# Patient Record
Sex: Male | Born: 1955 | Race: White | Hispanic: No | Marital: Married | Smoking: Former smoker
Health system: Southern US, Community
[De-identification: ages and names within clinical notes are randomized; demographics above are authoritative.]

## PROBLEM LIST (undated history)

## (undated) DIAGNOSIS — J449 Chronic obstructive pulmonary disease, unspecified: Secondary | ICD-10-CM

## (undated) DIAGNOSIS — M5030 Other cervical disc degeneration, unspecified cervical region: Secondary | ICD-10-CM

## (undated) DIAGNOSIS — K219 Gastro-esophageal reflux disease without esophagitis: Secondary | ICD-10-CM

## (undated) DIAGNOSIS — S83209A Unspecified tear of unspecified meniscus, current injury, unspecified knee, initial encounter: Secondary | ICD-10-CM

## (undated) HISTORY — DX: Chronic obstructive pulmonary disease, unspecified: J44.9

## (undated) HISTORY — PX: KNEE ARTHROSCOPY: SHX127

## (undated) HISTORY — DX: Gastro-esophageal reflux disease without esophagitis: K21.9

## (undated) HISTORY — PX: NECK SURGERY: SHX720

---

## 2004-04-11 ENCOUNTER — Ambulatory Visit: Payer: Self-pay | Admitting: Internal Medicine

## 2004-05-08 ENCOUNTER — Ambulatory Visit: Payer: Self-pay | Admitting: Internal Medicine

## 2004-06-08 ENCOUNTER — Ambulatory Visit: Payer: Self-pay | Admitting: Pulmonary Disease

## 2004-07-16 ENCOUNTER — Ambulatory Visit: Payer: Self-pay | Admitting: Pulmonary Disease

## 2004-08-14 ENCOUNTER — Ambulatory Visit: Payer: Self-pay | Admitting: Pulmonary Disease

## 2004-09-12 ENCOUNTER — Ambulatory Visit: Payer: Self-pay | Admitting: Pulmonary Disease

## 2004-10-16 ENCOUNTER — Ambulatory Visit: Payer: Self-pay | Admitting: Pulmonary Disease

## 2004-11-15 ENCOUNTER — Ambulatory Visit: Payer: Self-pay | Admitting: Pulmonary Disease

## 2004-12-20 ENCOUNTER — Ambulatory Visit: Payer: Self-pay | Admitting: Pulmonary Disease

## 2005-01-22 ENCOUNTER — Ambulatory Visit: Payer: Self-pay | Admitting: Internal Medicine

## 2005-02-19 ENCOUNTER — Ambulatory Visit: Payer: Self-pay | Admitting: Critical Care Medicine

## 2005-03-19 ENCOUNTER — Ambulatory Visit: Payer: Self-pay | Admitting: Pulmonary Disease

## 2005-03-25 ENCOUNTER — Ambulatory Visit: Payer: Self-pay | Admitting: Pulmonary Disease

## 2005-04-01 ENCOUNTER — Ambulatory Visit: Payer: Self-pay | Admitting: Pulmonary Disease

## 2005-04-15 ENCOUNTER — Ambulatory Visit: Payer: Self-pay | Admitting: Pulmonary Disease

## 2005-05-15 ENCOUNTER — Ambulatory Visit: Payer: Self-pay | Admitting: Critical Care Medicine

## 2005-06-12 ENCOUNTER — Ambulatory Visit: Payer: Self-pay | Admitting: Pulmonary Disease

## 2005-07-10 ENCOUNTER — Ambulatory Visit: Payer: Self-pay | Admitting: Critical Care Medicine

## 2005-08-07 ENCOUNTER — Ambulatory Visit: Payer: Self-pay | Admitting: Critical Care Medicine

## 2005-09-04 ENCOUNTER — Ambulatory Visit: Payer: Self-pay | Admitting: Pulmonary Disease

## 2005-10-02 ENCOUNTER — Ambulatory Visit: Payer: Self-pay | Admitting: Internal Medicine

## 2005-10-02 ENCOUNTER — Ambulatory Visit: Payer: Self-pay | Admitting: Pulmonary Disease

## 2005-10-30 ENCOUNTER — Ambulatory Visit: Payer: Self-pay | Admitting: Internal Medicine

## 2005-11-05 ENCOUNTER — Ambulatory Visit: Payer: Self-pay | Admitting: Pulmonary Disease

## 2005-12-10 ENCOUNTER — Ambulatory Visit: Payer: Self-pay | Admitting: Pulmonary Disease

## 2006-01-09 ENCOUNTER — Ambulatory Visit: Payer: Self-pay | Admitting: Pulmonary Disease

## 2006-02-06 ENCOUNTER — Ambulatory Visit: Payer: Self-pay | Admitting: Pulmonary Disease

## 2006-03-06 ENCOUNTER — Ambulatory Visit: Payer: Self-pay | Admitting: Pulmonary Disease

## 2006-04-07 ENCOUNTER — Ambulatory Visit: Payer: Self-pay | Admitting: Pulmonary Disease

## 2006-04-07 ENCOUNTER — Ambulatory Visit: Payer: Self-pay | Admitting: Critical Care Medicine

## 2006-04-16 ENCOUNTER — Ambulatory Visit: Payer: Self-pay | Admitting: Internal Medicine

## 2006-04-29 ENCOUNTER — Ambulatory Visit: Payer: Self-pay | Admitting: Internal Medicine

## 2006-05-05 ENCOUNTER — Ambulatory Visit: Payer: Self-pay | Admitting: Internal Medicine

## 2006-06-04 ENCOUNTER — Ambulatory Visit: Payer: Self-pay | Admitting: Internal Medicine

## 2006-07-30 ENCOUNTER — Ambulatory Visit: Payer: Self-pay | Admitting: Pulmonary Disease

## 2006-09-08 ENCOUNTER — Ambulatory Visit: Payer: Self-pay | Admitting: Pulmonary Disease

## 2006-09-29 ENCOUNTER — Ambulatory Visit: Payer: Self-pay | Admitting: Pulmonary Disease

## 2006-10-07 ENCOUNTER — Ambulatory Visit: Payer: Self-pay | Admitting: Pulmonary Disease

## 2006-11-11 ENCOUNTER — Ambulatory Visit: Payer: Self-pay | Admitting: Pulmonary Disease

## 2006-11-26 ENCOUNTER — Ambulatory Visit: Payer: Self-pay | Admitting: Pulmonary Disease

## 2006-12-04 ENCOUNTER — Ambulatory Visit: Payer: Self-pay | Admitting: Internal Medicine

## 2006-12-12 ENCOUNTER — Ambulatory Visit: Payer: Self-pay | Admitting: Pulmonary Disease

## 2007-01-12 ENCOUNTER — Ambulatory Visit: Payer: Self-pay | Admitting: Pulmonary Disease

## 2007-01-12 ENCOUNTER — Ambulatory Visit: Payer: Self-pay | Admitting: Internal Medicine

## 2007-02-18 ENCOUNTER — Ambulatory Visit: Payer: Self-pay | Admitting: Pulmonary Disease

## 2007-03-23 ENCOUNTER — Ambulatory Visit: Payer: Self-pay | Admitting: Pulmonary Disease

## 2007-05-04 ENCOUNTER — Ambulatory Visit: Payer: Self-pay | Admitting: Pulmonary Disease

## 2007-06-02 ENCOUNTER — Ambulatory Visit: Payer: Self-pay | Admitting: Internal Medicine

## 2007-07-10 ENCOUNTER — Ambulatory Visit: Payer: Self-pay | Admitting: Internal Medicine

## 2007-08-12 ENCOUNTER — Ambulatory Visit: Payer: Self-pay | Admitting: Pulmonary Disease

## 2007-09-04 ENCOUNTER — Encounter (INDEPENDENT_AMBULATORY_CARE_PROVIDER_SITE_OTHER): Payer: Self-pay | Admitting: *Deleted

## 2007-09-08 DIAGNOSIS — K219 Gastro-esophageal reflux disease without esophagitis: Secondary | ICD-10-CM | POA: Insufficient documentation

## 2007-09-08 DIAGNOSIS — J449 Chronic obstructive pulmonary disease, unspecified: Secondary | ICD-10-CM

## 2007-09-08 DIAGNOSIS — J984 Other disorders of lung: Secondary | ICD-10-CM

## 2007-09-09 ENCOUNTER — Encounter: Payer: Self-pay | Admitting: Internal Medicine

## 2007-09-09 ENCOUNTER — Ambulatory Visit: Payer: Self-pay | Admitting: Internal Medicine

## 2007-09-15 DIAGNOSIS — J45909 Unspecified asthma, uncomplicated: Secondary | ICD-10-CM | POA: Insufficient documentation

## 2007-10-06 ENCOUNTER — Ambulatory Visit: Payer: Self-pay | Admitting: Internal Medicine

## 2007-11-05 ENCOUNTER — Ambulatory Visit: Payer: Self-pay | Admitting: Internal Medicine

## 2007-12-08 ENCOUNTER — Ambulatory Visit: Payer: Self-pay | Admitting: Internal Medicine

## 2008-01-05 ENCOUNTER — Ambulatory Visit: Payer: Self-pay | Admitting: Internal Medicine

## 2008-02-04 ENCOUNTER — Ambulatory Visit: Payer: Self-pay | Admitting: Internal Medicine

## 2008-02-23 ENCOUNTER — Encounter: Payer: Self-pay | Admitting: Internal Medicine

## 2008-03-09 ENCOUNTER — Ambulatory Visit: Payer: Self-pay | Admitting: Internal Medicine

## 2008-04-08 ENCOUNTER — Ambulatory Visit: Payer: Self-pay | Admitting: Internal Medicine

## 2008-05-06 ENCOUNTER — Ambulatory Visit: Payer: Self-pay | Admitting: Internal Medicine

## 2008-06-07 ENCOUNTER — Encounter: Payer: Self-pay | Admitting: Internal Medicine

## 2008-06-08 ENCOUNTER — Ambulatory Visit: Payer: Self-pay | Admitting: Internal Medicine

## 2008-06-10 ENCOUNTER — Telehealth (INDEPENDENT_AMBULATORY_CARE_PROVIDER_SITE_OTHER): Payer: Self-pay | Admitting: *Deleted

## 2008-06-17 ENCOUNTER — Encounter: Payer: Self-pay | Admitting: Internal Medicine

## 2008-07-05 ENCOUNTER — Ambulatory Visit: Payer: Self-pay | Admitting: Internal Medicine

## 2008-08-04 ENCOUNTER — Ambulatory Visit: Payer: Self-pay | Admitting: Internal Medicine

## 2008-08-12 ENCOUNTER — Telehealth: Payer: Self-pay | Admitting: Pulmonary Disease

## 2008-08-12 ENCOUNTER — Encounter: Payer: Self-pay | Admitting: Pulmonary Disease

## 2008-09-07 ENCOUNTER — Ambulatory Visit: Payer: Self-pay | Admitting: Internal Medicine

## 2008-10-05 ENCOUNTER — Ambulatory Visit: Payer: Self-pay | Admitting: Internal Medicine

## 2008-10-25 ENCOUNTER — Ambulatory Visit: Payer: Self-pay | Admitting: Internal Medicine

## 2008-10-25 DIAGNOSIS — J45901 Unspecified asthma with (acute) exacerbation: Secondary | ICD-10-CM | POA: Insufficient documentation

## 2008-10-30 ENCOUNTER — Ambulatory Visit: Payer: Self-pay | Admitting: Pulmonary Disease

## 2008-10-30 ENCOUNTER — Inpatient Hospital Stay (HOSPITAL_COMMUNITY): Admission: EM | Admit: 2008-10-30 | Discharge: 2008-11-02 | Payer: Self-pay | Admitting: Emergency Medicine

## 2008-11-10 ENCOUNTER — Ambulatory Visit: Payer: Self-pay | Admitting: Internal Medicine

## 2008-11-16 ENCOUNTER — Telehealth: Payer: Self-pay | Admitting: Pulmonary Disease

## 2008-12-02 ENCOUNTER — Ambulatory Visit (HOSPITAL_COMMUNITY): Admission: RE | Admit: 2008-12-02 | Discharge: 2008-12-02 | Payer: Self-pay | Admitting: Neurological Surgery

## 2009-04-07 ENCOUNTER — Telehealth: Payer: Self-pay | Admitting: Internal Medicine

## 2009-04-20 ENCOUNTER — Ambulatory Visit: Payer: Self-pay | Admitting: Internal Medicine

## 2009-05-02 ENCOUNTER — Encounter: Payer: Self-pay | Admitting: Internal Medicine

## 2009-05-03 ENCOUNTER — Encounter: Payer: Self-pay | Admitting: Internal Medicine

## 2009-10-10 ENCOUNTER — Ambulatory Visit: Payer: Self-pay | Admitting: Internal Medicine

## 2009-11-13 ENCOUNTER — Ambulatory Visit: Payer: Self-pay | Admitting: Internal Medicine

## 2010-06-07 ENCOUNTER — Telehealth (INDEPENDENT_AMBULATORY_CARE_PROVIDER_SITE_OTHER): Payer: Self-pay | Admitting: *Deleted

## 2010-06-14 ENCOUNTER — Ambulatory Visit
Admission: RE | Admit: 2010-06-14 | Discharge: 2010-06-14 | Payer: Self-pay | Source: Home / Self Care | Attending: Internal Medicine | Admitting: Internal Medicine

## 2010-06-17 ENCOUNTER — Emergency Department (HOSPITAL_BASED_OUTPATIENT_CLINIC_OR_DEPARTMENT_OTHER)
Admission: EM | Admit: 2010-06-17 | Discharge: 2010-06-17 | Payer: Self-pay | Source: Home / Self Care | Admitting: Emergency Medicine

## 2010-07-03 NOTE — Assessment & Plan Note (Signed)
Summary: Pulmonary/ ext f/u ov with HFA 90% effective, add spiriva   Primary Provider/Referring Provider:  Rosezetta Schlatter  CC:  Acute visit.  Pt c/o tightness in chest and wheezing and cough since March 2011.  Cough is mainly dry but sometimes produces clear sputum.  [name]  History of Present Illness: 55 yowm   quit smoking in 1999  an FEV1 of 45%  predicted, documented in December of 2007.     July 05, 2008-routine office viist. Doing well rare use of duoneb,  still get SOB w/ activity and wears out easily.   OV 10/25/2008. Followup Asthma. Maintained on Xolair + symbi + singulair + zyrtec + PPI.  Made an acute visit to discuss subacute worsenig of asthma. Here with wife. Wants consideration for thermoplasty. Wonders if O2 will help him; claims desaturation on treadmill or while performing karate.   November 10, 2008 ov on best days no limits in terms of activity off 02 including going up steps, kayak carry and paddling  without difficulty but presently still on prednisone at 22 and has pred dependent since around first of march,  also maintaining xolair x at least 3 years ? benefit.  maint on  singulair, zyrtec, advair, protonix,  as needed combivent or duoneb but no using at all.   April 20, 2009 no limitations in breathing, no nocturnal resp co's, the best he's done in years. rec work on inhaler technique treatment.  Oct 10, 2009 Acute visit.  Pt c/o tightness in chest, wheezing and cough since March 2011.  Cough is mainly dry but sometimes produces clear sputum.  has not been on symbicort/spiriva though is using lots of combivent.   no purulent sputum. Pt denies any significant sore throat, dysphagia, itching, sneezing,  nasal congestion or excess secretions,  fever, chills, sweats, unintended wt loss, pleuritic or exertional cp, hempoptysis, change in activity tolerance  orthopnea pnd or leg swelling Pt also denies any obvious fluctuation in symptoms with weather or environmental change or other  alleviating or aggravating factors.       Current Medications (verified): 1)  Protonix 40 Mg  Solr (Pantoprazole Sodium) .... Take  One 30-60 Min Before First Meal of The Day 2)  Symbicort 160-4.5 Mcg/act Aero (Budesonide-Formoterol Fumarate) .... 2 Puffs Every 12 Hours 3)  Combivent 103-18 Mcg/act  Aero (Ipratropium-Albuterol) .... Inhale 2 Puffs Every 4-6 Hours As Needed 4)  Duoneb 0.5-2.5 (3) Mg/45ml  Soln (Ipratropium-Albuterol) .... Inhale 1 Vial Via Hhn Every 4 Hours As Needed 5)  Zyrtec Hives Relief 10 Mg Tabs (Cetirizine Hcl) .[name].. 1 By Mouth At Bedtime  Allergies (verified): 1)  ! Pcn  Past History:  Past Medical History: GERD COPD/AB..............................................................................................Travis KitchenDr [name]     -quit smoking in 1999 . Smoked 1 pack a day for 20-25 years    - FEV1 of 45% of the predicted, documented in December of 2007.      - FEV1     48%   November 10, 2008  with ratio 49    - Alpha1AT nl  10/26/2007    - HFA 75% November 10, 2008 >  90% April 20, 2009 > 100% Oct 10, 2009     - Xolair stopped after approx 3 years November 10, 2008     Vital Signs:  Patient profile:   55 year old male Weight:      180 pounds BMI:     26.68 O2 Sat:      93 % on Room air Temp:  97.1 degrees F oral Pulse rate:   96 / minute BP sitting:   108 / 72  (left arm)  Vitals Entered By: Vernie Murders (Oct 10, 2009 1:41 PM)  O2 Flow:  Room air  Physical Exam  Additional Exam:  wt  184 > 180 Oct 10, 2009  HEENT mild turbinate edema.  Oropharynx no thrush or excess pnd or cobblestoning.  No JVD or cervical adenopathy. Mild accessory muscle hypertrophy. Trachea midline, nl thryroid. Chest was hyperinflated by percussion with diminished breath sounds and moderate increased exp time without wheeze. Hoover sign positive at mid inspiration. Regular rate and rhythm without murmur gallop or rub or increase P2 or edema.  Abd: no hsm, nl excursion. Ext warm without  cyanosis or clubbing.     Impression & Recommendations:  Problem # 1:  COPD (ICD-496) GOLD III with excess use of combivent  DDX of  difficult airways managment all start with A and  include Adherence, Ace Inhibitors, Acid Reflux, Active Sinus Disease, Alpha 1 Antitripsin deficiency, Anxiety masquerading as Airways dz,  ABPA,  allergy(esp in young), Aspiration (esp in elderly), Adverse effects of DPI,  Active smokers, plus one B  = Beta blocker use..    Adherence:  reviewed present rx and mdi technique now 90% after coaching Rec add spiriva and use xopenex as a rescue  Acid Reflux:  use ppi and diet reviewed  Steroid rx reviewed. The goal with a chronic steroid dependent illness is always arriving at the lowest effective dose that controls the disease/symptoms and not accepting a set "formula" which is based on statistics that don't take into accound individual variability or the natural hx of the dz in every individual patient, which may well vary over time. for now pred 20 taper to floor of 0 is goal.  Medications Added to Medication List This Visit: 1)  Spiriva Handihaler 18 Mcg Caps (Tiotropium bromide monohydrate) .... Two puffs in handihaler daily 2)  Xopenex Hfa 45 Mcg/act Aero (Levalbuterol tartrate) .... 2 puffs every 4hours if needed 3)  Prednisone 10 Mg Tabs (Prednisone) .... Prednisone 10 mg  2 daily until better then one daily x 5 days and stop  Other Orders: Est. Patient Level IV (16109)  Patient Instructions: 1)  I think of spiriva in this setting like purchasing high octane fuel for an older car with lots of miles on it. It may help the perfomance enough to warrant the purchase, but it won't change the longevity of the car or make it any easier parking it. It should improve peak performance if the patient is patient and lets the medicine work the way it's intended  - improving activity tolerance where limits on the mechanical ventilatory ceiling causing dynamic  hyperinflation is the problem.  2)  Spiriva 1 capsule each am, two good deep drags x one month trial 3)  If breathing worsening,  Prednisone 10 mg 2 each am 100%, then 1 each am and stop after 5 days. 4)  If real short of short breath (needing prednisone) ok to use rescue of xopenex but goal is less twice weekly 5)  Try off combivent 6)  only duoneb in severe cases  7)  Please schedule a follow-up appointment in 4 weeks, sooner if needed  Prescriptions: PREDNISONE 10 MG  TABS (PREDNISONE) Prednisone 10 mg  2 daily until better then one daily x 5 days and stop  #50 x 0   Entered and Authorized by:   Nyoka Cowden MD  Signed by:   Nyoka Cowden MD on 10/10/2009   Method used:   Electronically to        [facility]  [address] [phone]* (retail)       [address] [place], [place]  [address]       Ph: [phone] or [phone]       Fax: [phone]   RxID:   [id number]   Appended Document: Pulmonary/ ext f/u ov with HFA 90% effective, add spiriva fax copy to Dr Rosezetta Schlatter at [facility] prac  Appended Document: Pulmonary/ ext f/u ov with HFA 90% effective, add spiriva Copy sent to Dr Rosezetta Schlatter via [place].

## 2010-07-03 NOTE — Assessment & Plan Note (Signed)
Summary: Pulmonary/ ext f/u ov with hfa/dpi teaching > 90% mastered   Primary Provider/Referring Provider:  Rosezetta Schlatter  CC:  4 wk followup. Pt c/o sore throat "comes and goes" x 2 wks.  He states that the wheezing has resolved but still has some chest congestion and prod cough with thick clear sputum.Marland Kitchen  History of Present Illness: 59 yowm  quit smoking in 1999  an FEV1 of 45%  predicted, documented in December of 2007.    une 10, 2010 ov on best days no limits in terms of activity off 02 including going up steps, kayak carry and paddling  without difficulty but presently still on prednisone at 22 and has pred dependent since around first of march,  also maintaining xolair x at least 3 years ? benefit.  maint on  singulair, zyrtec, advair, protonix,  as needed combivent or duoneb but no using at all.   April 20, 2009 no limitations in breathing, no nocturnal resp co's, the best he's done in years. rec work on inhaler technique treatment.  Oct 10, 2009 Acute visit.  Pt c/o tightness in chest, wheezing and cough since March 2011.  Cough is mainly dry but sometimes produces clear sputum.  has not been on symbicort/spiriva though is using lots of combivent.   Spiriva 1 capsule each am, two good deep drags x one month trial If breathing worsening,   If real short of short breath (needing prednisone) ok to use rescue of xopenex but goal is less twice weekly Try off combiven  November 13, 2009 4 wk followup. Pt c/o sore throat "comes and goes" x 2 wks.  He states that the wheezing has resolved but still has some chest congestion and prod cough with thick clear sputum. Overall better while on spriva and as needed xopenex.  Pt denies any significant  dysphagia, itching, sneezing,  nasal congestion or excess secretions,  fever, chills, sweats, unintended wt loss, pleuritic or exertional cp, hempoptysis, change in activity tolerance  orthopnea pnd or leg swelling.Pt also denies any obvious fluctuation in  symptoms with weather or environmental change or other alleviating or aggravating factors.       Current Medications (verified): 1)  Protonix 40 Mg  Solr (Pantoprazole Sodium) .... Take  One 30-60 Min Before First Meal of The Day 2)  Symbicort 160-4.5 Mcg/act Aero (Budesonide-Formoterol Fumarate) .... 2 Puffs Every 12 Hours 3)  Zyrtec Hives Relief 10 Mg Tabs (Cetirizine Hcl) .Marland Kitchen.. 1 By Mouth At Bedtime 4)  Xopenex Hfa 45 Mcg/act Aero (Levalbuterol Tartrate) .... 2 Puffs Every 4hours If Needed 5)  Duoneb 0.5-2.5 (3) Mg/24ml  Soln (Ipratropium-Albuterol) .... Inhale 1 Vial Via Hhn Every 4 Hours As Needed  Allergies (verified): 1)  ! Pcn  Past History:  Past Medical History: GERD COPD/AB................................................................................................Marland KitchenDr [name]     -quit smoking in 1999 . Smoked 1 pack a day for 20-25 years    - FEV1 of 45% of the predicted, documented in December of 2007.      - FEV1     48%   November 10, 2008  with ratio 49    - Alpha1AT nl  10/26/2007    - HFA 75% November 10, 2008 >  90% April 20, 2009 > 100% Oct 10, 2009     - Xolair stopped after approx 3 years November 10, 2008     Vital Signs:  Patient profile:   55 year old male Weight:      182 pounds O2 Sat:  93 % on Room air Temp:     98.7 degrees F oral Pulse rate:   82 / minute BP sitting:   138 / 86  (left arm)  Vitals Entered By: Vernie Murders (November 13, 2009 11:14 AM)  O2 Flow:  Room air  Physical Exam  Additional Exam:  wt   180 Oct 10, 2009 > 182 November 13, 2009  HEENT mild turbinate edema.  Oropharynx no thrush or excess pnd or cobblestoning.  No JVD or cervical adenopathy. Mild accessory muscle hypertrophy. Trachea midline, nl thryroid. Chest was hyperinflated by percussion with diminished breath sounds and moderate increased exp time without wheeze. Hoover sign positive at mid inspiration. Regular rate and rhythm without murmur gallop or rub or increase P2 or edema.   Abd: no hsm, nl excursion. Ext warm without cyanosis or clubbing.     Impression & Recommendations:  Problem # 1:  COPD (ICD-496) GOLD III with improvement on spriva  I spent extra time with the patient today explaining optimal DPI   technique.  This improved from  90-100%.  See instructions for specific recommendations   Problem # 2:  G E R D (ICD-530.81)  Explained natural h/o uri and why it's necessary in patients at risk to rx short term with PPI to reduce risk of evolving cyclical cough triggered by epithelial injury and a heightened sensitivty to the effects of any upper airway irritants,  most importantly acid - related.  For now now need for abx, try pred x 6 day cycle and add pm dose of protonix until cough is resolved.    Orders: Est. Patient Level IV (16109)  Medications Added to Medication List This Visit: 1)  Spiriva Handihaler 18 Mcg Caps (Tiotropium bromide monohydrate) .... Two puffs in handihaler daily 2)  Xopenex Hfa 45 Mcg/act Aero (Levalbuterol tartrate)  Patient Instructions: 1)  Continue spiriva and symbicort 2)  Take prednisone dose pack 3)  When respiratory symptoms flare add protonix before supper also. 4)  Return to office in 3 months, sooner if needed  Prescriptions: XOPENEX HFA 45 MCG/ACT AERO (LEVALBUTEROL TARTRATE)   #3 x 3   Entered and Authorized by:   Nyoka Cowden MD   Signed by:   Nyoka Cowden MD on 11/13/2009   Method used:   Print then Give to Patient   RxID:   [id number] SPIRIVA HANDIHALER 18 MCG  CAPS (TIOTROPIUM BROMIDE MONOHYDRATE) Two puffs in handihaler daily  #90 x 3   Entered and Authorized by:   Nyoka Cowden MD   Signed by:   Nyoka Cowden MD on 11/13/2009   Method used:   Print then Give to Patient   RxID:   [id number]

## 2010-07-03 NOTE — Miscellaneous (Signed)
Summary: Injection Record/[facility]  Injection Record/[facility]   Imported By: Sherian Rein 10/05/2009 14:16:06  _____________________________________________________________________  External Attachment:    Type:   Image     Comment:   External Document

## 2010-07-05 NOTE — Assessment & Plan Note (Signed)
Summary: Pulmonary/ f/u ov for copd/ab   Primary Provider/Referring Provider:  Rosezetta Schlatter  CC:  Dyspnea and cough- the same.  History of Present Illness: 31 yowm  quit smoking in 1999  an FEV1 of 45%  predicted, documented in December of 2007.    November 10, 2008 ov on best days no limits in terms of activity off 02 including going up steps, kayak carry and paddling  without difficulty but presently still on prednisone at 22 and has pred dependent since around first of march,  also maintaining xolair x at least 3 years ? benefit.  maint on  singulair, zyrtec, advair, protonix,  as needed combivent or duoneb but no using at all.   April 20, 2009 no limitations in breathing, no nocturnal resp co's, the best he's done in years. rec work on inhaler technique treatment.  Oct 10, 2009 Acute visit.  Pt c/o tightness in chest, wheezing and cough since March 2011.  Cough is mainly dry but sometimes produces clear sputum.  has not been on symbicort/spiriva though is using lots of combivent.   Spiriva 1 capsule each am, two good deep drags x one month trial If breathing worsening,   If real short of short breath (needing prednisone) ok to use rescue of xopenex but goal is less twice weekly Try off combiven  November 13, 2009 4 wk followup. Pt c/o sore throat "comes and goes" x 2 wks.  He states that the wheezing has resolved but still has some chest congestion and prod cough with thick clear sputum. Overall better while on spriva and as needed xopenex.  rec Continue spiriva and symbicort Take prednisone dose pack When respiratory symptoms flare add protonix before supper also.  June 14, 2010 Dyspnea and cough- the same using xopenex sev times  a week but not every day. overall doing much better and just taking ppi qam.  Pt denies any significant sore throat, dysphagia, itching, sneezing,  nasal congestion or excess secretions,  fever, chills, sweats, unintended wt loss, pleuritic or exertional cp,  hempoptysis, change in activity tolerance  orthopnea pnd or leg swelling   Current Medications (verified): 1)  Protonix 40 Mg  Solr (Pantoprazole Sodium) .... Take  One 30-60 Min Before First Meal of The Day 2)  Symbicort 160-4.5 Mcg/act Aero (Budesonide-Formoterol Fumarate) .... 2 Puffs Every 12 Hours 3)  Zyrtec Hives Relief 10 Mg Tabs (Cetirizine Hcl) .Marland Kitchen.. 1 By Mouth At Bedtime 4)  Xopenex Hfa 45 Mcg/act Aero (Levalbuterol Tartrate) .... 2 Puffs Every 4hours If Needed 5)  Duoneb 0.5-2.5 (3) Mg/58ml  Soln (Ipratropium-Albuterol) .... Inhale 1 Vial Via Hhn Every 4 Hours As Needed 6)  Spiriva Handihaler 18 Mcg  Caps (Tiotropium Bromide Monohydrate) .... Two Puffs in Handihaler Daily 7)  Xanax 0.5 Mg Tabs (Alprazolam) .Marland Kitchen.. 1 Every 6 Hrs As Needed 8)  Multivitamins  Tabs (Multiple Vitamin) .Marland Kitchen.. 1 Once Daily  Allergies (verified): 1)  ! Pcn  Past History:  Past Medical History: GERD COPD/AB................................................................................................Marland KitchenDr [name]     -quit smoking in 1999 . Smoked 1 pack a day for 20-25 years    - FEV1 of 45% of the predicted, documented in December of 2007.      - FEV1     48%   November 10, 2008  with ratio 49    - Alpha1AT nl  10/26/2007    - HFA 75% November 10, 2008 >  90% April 20, 2009  > 100% Oct 10, 2009     -  Xolair stopped after approx 3 years November 10, 2008     Vital Signs:  Patient profile:   55 year old male Weight:      188 pounds O2 Sat:      94 % on Room air Temp:     97.8 degrees F oral Pulse rate:   80 / minute BP sitting:   120 / 80  (left arm)  Vitals Entered By: Vernie Murders (June 14, 2010 10:45 AM)  O2 Flow:  Room air  Physical Exam  Additional Exam:  wt   180 Oct 10, 2009 > 182 November 13, 2009  > 188 June 14, 2010  HEENT mild turbinate edema.  Oropharynx no thrush or excess pnd or cobblestoning.  No JVD or cervical adenopathy. Mild accessory muscle hypertrophy. Trachea midline, nl thryroid.  Chest was hyperinflated by percussion with diminished breath sounds and moderate increased exp time without wheeze. Hoover sign positive at mid inspiration. Regular rate and rhythm without murmur gallop or rub or increase P2 or edema.  Abd: no hsm, nl excursion. Ext warm without cyanosis or clubbing.     Impression & Recommendations:  Problem # 1:  COPD (ICD-496)  GOLD III with improvement on spriva and clinical evidence of an asthmatic component.    All goals of asthma met including optimal function and elimination of symptoms with minimum need for rescue therapy. Contingencies discussed today including the rule of two's which he's meeting nicely on present rx.  See instructions for specific recommendations   Medications Added to Medication List This Visit: 1)  Symbicort 160-4.5 Mcg/act Aero (Budesonide-formoterol fumarate) .... 2 puffs first thing  in am and 2 puffs again in pm about 12 hours later 2)  Multivitamins Tabs (Multiple vitamin) .Marland Kitchen.. 1 once daily  Other Orders: Est. Patient Level III (16109)  Patient Instructions: 1)  No change in rx for symbicort 160 2 puffs first thing  in am and 2 puffs again in pm about 12 hours later and spirva each am 2)  If your breathing worsens or you need to use your rescue inhaler more than once daily or wake up more than twice a month with any respiratory symptoms or require more than two rescue inhalers per year, we need to see you right away. 3)  Return to office in 6  months, sooner if needed  4)    Prescriptions: SYMBICORT 160-4.5 MCG/ACT AERO (BUDESONIDE-FORMOTEROL FUMARATE) 2 puffs first thing  in am and 2 puffs again in pm about 12 hours later  #3 x 4   Entered and Authorized by:   Nyoka Cowden MD   Signed by:   Nyoka Cowden MD on 06/14/2010   Method used:   Print then Give to Patient   RxID:   [id number]

## 2010-07-05 NOTE — Progress Notes (Signed)
Summary: request for med for anxiety > limited xanax  Phone Note Call from Patient   Caller: carolyn-wife Call For: wert Summary of Call: pt just got laid offhis job after 38yrs and is very upset can dr wert give him something to help calm him [facility] call [phone] Initial call taken by: Oneita Jolly,  June 07, 2010 12:40 PM  Follow-up for Phone Call        Spoke with pt's spouse.  She states that pt was layed off from his job he has been at for the past 33 yrs, very upset, "feels numb" and very nervous.  She is requesting that we call in something for his nerves just for short term until he is feeling better.  She states that if needed she will try his PCP but has not seen him recently and ins will soon run out.  Pls advise, thanks! Follow-up by: Vernie Murders,  June 07, 2010 3:17 PM  Additional Follow-up for Phone Call Additional follow up Details #1::        xanax 0.5 mg one every 6 hours as neederd #30 Additional Follow-up by: Nyoka Cowden MD,  June 07, 2010 3:23 PM    Additional Follow-up for Phone Call Additional follow up Details #2::    Spoke with pt's spouse and notified that this was called in.   Follow-up by: Vernie Murders,  June 07, 2010 3:31 PM  New/Updated Medications: XANAX 0.5 MG TABS (ALPRAZOLAM) 1 every 6 hrs as needed Prescriptions: XANAX 0.5 MG TABS (ALPRAZOLAM) 1 every 6 hrs as needed  #30 x 0   Entered by:   Vernie Murders   Authorized by:   Nyoka Cowden MD   Signed by:   Vernie Murders on 06/07/2010   Method used:   Telephoned to ...       [facility]  [address] [phone][facility] ([facility]ail[facility]       [address]       [place], [place]  [address]       Ph: [phone] or [phone]       Fax: [phone]   RxID:   [id number]

## 2010-09-10 LAB — COMPREHENSIVE METABOLIC PANEL
AST: 19 U/L (ref 0–37)
Alkaline Phosphatase: 53 U/L (ref 39–117)
BUN: 24 mg/dL — ABNORMAL HIGH (ref 6–23)
CO2: 28 mEq/L (ref 19–32)
Chloride: 102 mEq/L (ref 96–112)
Creatinine, Ser: 0.93 mg/dL (ref 0.4–1.5)
GFR calc non Af Amer: >60 mL/min (ref >60)
Potassium: 4.6 mEq/L (ref 3.5–5.1)
Total Bilirubin: 0.7 mg/dL (ref 0.3–1.2)

## 2010-09-10 LAB — CBC
HCT: 43.7 % (ref 39.0–52.0)
Hemoglobin: 13.6 g/dL (ref 13.0–17.0)
MCHC: 34.8 g/dL (ref 30.0–36.0)
MCV: 92.2 fL (ref 78.0–100.0)
MCV: 93.9 fL (ref 78.0–100.0)
RBC: 4.65 MIL/uL (ref 4.22–5.81)
RDW: 14.1 % (ref 11.5–15.5)
WBC: 19.5 10*3/uL — ABNORMAL HIGH (ref 4.0–10.5)

## 2010-09-10 LAB — GLUCOSE, CAPILLARY: Glucose-Capillary: 245 mg/dL — ABNORMAL HIGH (ref 70–99)

## 2010-09-11 LAB — COMPREHENSIVE METABOLIC PANEL
ALT: 41 U/L (ref 0–53)
ALT: 46 U/L (ref 0–53)
AST: 29 U/L (ref 0–37)
Albumin: 4 g/dL (ref 3.5–5.2)
Alkaline Phosphatase: 62 U/L (ref 39–117)
BUN: 16 mg/dL (ref 6–23)
CO2: 26 mEq/L (ref 19–32)
Calcium: 9.7 mg/dL (ref 8.4–10.5)
Chloride: 104 mEq/L (ref 96–112)
Creatinine, Ser: 0.87 mg/dL (ref 0.4–1.5)
GFR calc Af Amer: >60 mL/min (ref >60)
Glucose, Bld: 282 mg/dL — ABNORMAL HIGH (ref 70–99)
Potassium: 4.2 mEq/L (ref 3.5–5.1)
Sodium: 139 mEq/L (ref 135–145)
Sodium: 142 mEq/L (ref 135–145)
Total Bilirubin: 0.4 mg/dL (ref 0.3–1.2)
Total Protein: 6.6 g/dL (ref 6.0–8.3)

## 2010-09-11 LAB — CBC
HCT: 45.1 % (ref 39.0–52.0)
Hemoglobin: 15.2 g/dL (ref 13.0–17.0)
MCHC: 33.2 g/dL (ref 30.0–36.0)
MCV: 92.9 fL (ref 78.0–100.0)
MCV: 94.6 fL (ref 78.0–100.0)
Platelets: 283 10*3/uL (ref 150–400)
Platelets: 308 10*3/uL (ref 150–400)
RBC: 4.58 MIL/uL (ref 4.22–5.81)
WBC: 11.3 10*3/uL — ABNORMAL HIGH (ref 4.0–10.5)
WBC: 13.5 10*3/uL — ABNORMAL HIGH (ref 4.0–10.5)

## 2010-09-11 LAB — DIFFERENTIAL
Eosinophils Absolute: 0 10*3/uL (ref 0.0–0.7)
Eosinophils Relative: 0 % (ref 0–5)
Lymphocytes Relative: 13 % (ref 12–46)
Lymphs Abs: 1.5 10*3/uL (ref 0.7–4.0)
Monocytes Absolute: 0.4 10*3/uL (ref 0.1–1.0)
Monocytes Relative: 4 % (ref 3–12)

## 2010-09-11 LAB — POCT I-STAT, CHEM 8
BUN: 16 mg/dL (ref 6–23)
Calcium, Ion: 1.25 mmol/L (ref 1.12–1.32)
Creatinine, Ser: 0.8 mg/dL (ref 0.4–1.5)
Glucose, Bld: 267 mg/dL — ABNORMAL HIGH (ref 70–99)
Hemoglobin: 16.7 g/dL (ref 13.0–17.0)
Sodium: 138 mEq/L (ref 135–145)
TCO2: 24 mmol/L (ref 0–100)

## 2010-09-11 LAB — DIC (DISSEMINATED INTRAVASCULAR COAGULATION)PANEL
Fibrinogen: 248 mg/dL (ref 204–475)
Platelets: 309 10*3/uL (ref 150–400)
Prothrombin Time: 12.9 seconds (ref 11.6–15.2)
Smear Review: NONE SEEN

## 2010-09-11 LAB — SEDIMENTATION RATE: Sed Rate: 3 mm/hr (ref 0–16)

## 2010-09-11 LAB — MAGNESIUM: Magnesium: 2.7 mg/dL — ABNORMAL HIGH (ref 1.5–2.5)

## 2010-09-11 LAB — GLUCOSE, CAPILLARY
Glucose-Capillary: 242 mg/dL — ABNORMAL HIGH (ref 70–99)
Glucose-Capillary: 274 mg/dL — ABNORMAL HIGH (ref 70–99)

## 2010-09-11 LAB — CK TOTAL AND CKMB (NOT AT ARMC): Total CK: 44 U/L (ref 7–232)

## 2010-10-16 NOTE — Op Note (Signed)
[name]              ACCOUNT NO.:  [id number]   MEDICAL RECORD NO.:  [id number]          PATIENT TYPE:  OIB   LOCATION:  3537                         FACILITY:  [facility]   PHYSICIAN:  Stefani Dama, M.D.  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   PROCEDURES:  Anterior cervical decompression, C5-C6; decompression of  the spinal cord and left C6 nerve root; preparation of disk space for  interbody arthrodesis with structural allograft; anterior plate fixation  with Alphatec plate.   SURGEON:  Stefani Dama, MD   FIRST ASSISTANT:  Payton Doughty, MD   ANESTHESIA:  General endotracheal.   INDICATIONS:  [name] is a 55 year old individual, who for the  last several weeks has had increasing pain and weakness in the upper  extremity on the left side.  He notes that the pain has just become  progressively worse to where he cannot tolerate it at all and now has a  large herniated nucleus pulposus on top of some modest spondylosis of C5-  C6 level, which now causes cord compression and intrinsic cord signal  change.  The patient has been advised regarding urgent need for surgical  decompression because of the compression of spinal cord.   PROCEDURE IN DETAIL:  The patient was brought to the operating room  supine on the stretcher.  After smooth induction of general endotracheal  anesthesia, he was placed in 5 pounds Holter traction.  Neck was prepped  with alcohol and DuraPrep and draped in a sterile fashion.  A transverse  incision was created in the left side of the neck and this was carried  down  through the platysma.  Plane between the sternocleidomastoid and  strap muscles were dissected bluntly until the prevertebral space was  reached.  First identifiable disk space was noted to be that of C4-C5 on  localizing radiograph.  The C5-C6 prevertebral tissues were then cleared  and the longus coli muscle was separated along the lateral borders of  the disk space.  Self-retaining Caspar retractor was then placed in this  wound to maintain distraction.  The disk space was opened with a #15  blade and a combination of Kerrison rongeurs was used to evacuate a  significant quantity of severely degenerated desiccated material.  Thus,  region of posterior longitudinal ligament was reached.  There was noted  to be some herniated material into the free space of the disk space at  the level of C5-C6 on the left.  This was dissected free and then  removed and then some thickening growth of the posterior longitudinal  ligament in this area was encountered, and this was removed with a 2-mm  Kerrison punch.  Care was taken to dissect out laterally the takeoff of  C6 nerve root.  Once this was free and cleared, attention was turned to  the right side where similar decompression was performed.  High-speed  drill was then used to remove the inferior margin of body of C5, where a  bone spur was noted.  The superior margin body of C6 was also noted to  be some small spurring, which was trimmed with the high-speed drill.  In  the end, hemostasis in the disk space was achieved.  The endplates were  smoothed and made sure that there were released of any cartilaginous  endplate material and then a 7-mm trans graft was prepared by shaving  the endplates appropriately and filling this with demineralized bone  matrix.  This was placed into the interspace and countersunk  appropriately, 14-mm standard size Alphatec plate was then fitted to the  ventral aspect of the vertebral bodies with 4 locking variable  angle 14-  mm screws.  The wound was then irrigated copiously with antibiotic  irrigating solution.  Final radiograph confirmed good position of the  surgical construct.  With hemostasis being achieved, cervical dorsal  fascia was then closed with 3-0 Vicryl in the platysma, 3-0 Vicryl in  the subcuticular tissues.  Dermabond was placed on the skin.  The  patient tolerated the procedure well.      Stefani Dama, M.D.  Electronically Signed     HJE/MEDQ  D:  12/02/2008  T:  12/03/2008  Job:  [id number]

## 2010-10-16 NOTE — Discharge Summary (Signed)
[name], SPARKS              ACCOUNT NO.:  [id number]   MEDICAL RECORD NO.:  [id number]          PATIENT TYPE:  INP   LOCATION:  1406                         [facility]ACILITY:  [facility]   PHYSICIAN:  Oretha Milch, MD      DATE OF BIRTH:  March 25, 1956   DATE OF ADMISSION:  10/30/2008  DATE OF DISCHARGE:  11/02/2008                               DISCHARGE SUMMARY   DISCHARGE DIAGNOSES:  1. Acute exacerbation of chronic obstructive pulmonary disease with      significant asthmatic component.  2. Hyperglycemia.   LABORATORY DATA:  November 02, 2008:  Sodium 137, potassium 4.6, chloride  102, CO2 of 28, glucose 183 fasting, BUN 24, creatinine 0.93, white  blood cell count 19.5, hemoglobin 14.6, hematocrit 43.7, platelet count  237,000.  ESR on Oct 30, 2008, was 3.  Troponin I less than 0.01.  DIC  panel with D-dimer was less than 0.22.   BRIEF HISTORY:  This is a very pleasant, 55 year old, white male patient  who presented to the Emergency Room on Oct 30, 2008, with chief  complaint of 6-week history of progressive dyspnea and chest congestion.  He was seen in the office on Oct 25, 2008, at which time was felt to  have an exacerbation of asthma and was therefore given pulse dose  prednisone.  He had no improvements from this therapy and therefore  presented to Emergency Room.   HOSPITAL COURSE BY DISCHARGE DIAGNOSES:  Acute exacerbation of chronic  obstructive pulmonary disease, with asthmatic component.  Mr. Cavell  was admitted to the regular medical ward.  Therapeutic interventions  included supplemental oxygen, inhaled bronchodilators, empiric  azithromycin x3 days, IV systemic steroids.  He made marked improvement  over the course of his hospitalization and reached readiness for  discharge on November 02, 2008.  Of note, he did have some exertional hypoxia  with room air saturations noted to be 87% with ambulation on November 01, 2008.  Therefore, he will be discharged to home on supplemental  oxygen  to be worn with exertion and at time of sleep.  From a pulmonary  standpoint, he will be discharged to home with instructions to resume  his maintenance bronchodilator regimen, continue slow prednisone taper,  and followup with Dr. [name] in the outpatient setting.   DISCHARGE INSTRUCTIONS:  Diet as tolerated.  Activity increase as  tolerated.  Follow up with Dr. Sandrea Hughs June 9 at 11:30 a.m.   DISCHARGE MEDICATIONS:  1. Zyrtec 10 mg p.o. daily.  2. Prednisone taper 10 mg tab 4 tabs daily x3 days, then 3 tabs daily      x3 days, then 2 tabs daily x3 days, then 1 tab daily x3 days, then      discontinue.  3. Metformin 500 mg tab twice a day while on prednisone.  4. Oxygen 2 liters with exertion and sleep.  5. Xolair 150 mg specialize dosing at [facility].  6. DuoNeb as needed.  7. Combivent as needed.  8. Mucinex DM 600 mg tab by mouth as needed.  9. Protonix by mouth daily.  10.Singular  10 mg by mouth daily.  11.Advair 230/20 two puffs twice daily.   DISPOSITION:  Mr. Councilman has met maximum benefit from inpatient stay.  He is now medically cleared for discharge with followup as mentioned  above.      Zenia Resides, NP      Oretha Milch, MD  Electronically Signed    PB/MEDQ  D:  11/02/2008  T:  11/02/2008  Job:  [id number]

## 2010-10-16 NOTE — Assessment & Plan Note (Signed)
[facility]                             PULMONARY OFFICE NOTE   NAME:Jeschke, HARSHIL CAVALLARO                     MRN:          [id number]  DATE:11/26/2006                            DOB:          [birth year]    I met Mr. Girton today for followup of his chronic obstructive asthma.   He is currently on a regimen of Xolar injections monthly as well as  Symbicort 160/4.5 two puffs b.i.d., Singulair 10 mg nightly, and  albuterol inhaler, which he says he has been using 4-6 times a day for  the last week or so as well as Duoneb nebulizer.  He also is using an  over-the-counter antihistamine.  He said that over the last week or so,  he has noticed that he has been having problems with chest tightness,  wheezing, and congestion.  He is having a cough with production of clear  sputum but denies any hemoptysis.  He does have a tickle in his throat  as well as a globus sensation.  He denies any fevers or sweats.  He has  not had any skin rashes.  He works as a Comptroller.  He says  that he usually feels better in the morning, but as the day goes on, he  feels worse, particularly during the work week, although he does notice  the same pattern at home as well to some degree but not as much.  He  does have two dogs and a cat at home.   CURRENT MEDICATIONS:  1. Singulair 10 mg nightly.  2. Symbicort 160/4.5 two puffs b.i.d.  3. Protonix 40 mg daily.  4. Xolair injections once a month.  5. Duoneb nebulizer as needed.  6. ProAir HFA as needed.  7. An over-the-counter antihistamine as needed.   PHYSICAL EXAMINATION:  VITAL SIGNS:  He is 174 pounds.  Temperature is  98, blood pressure 118/70.  Heart rate is 87.  Oxygen saturation is 92%  on room air.  HEENT:  There is no sinus tenderness.  He has narrow nasal angles with  septal deviation to the right.  There is bilateral erythema to the  posterior pharynx.  There is no lymphadenopathy.  No thyromegaly.  HEART:  S1 and S2 with a regular rate and rhythm.  CHEST:  He had fine wheezes heard bilaterally and diffusely.  ABDOMEN:  Soft and nontender.  Positive bowel sounds.  EXTREMITIES:  There is no edema.   IMPRESSION:  Chronic obstructive asthma with acute exacerbation.  I will  give him a tapering dose of prednisone over the next one week.  I would  switch him from ProAir HFA to Combivent, which he is to use two puffs up  to 4 times a day as needed in conjunction with the use of his DuoNeb  nebulizer.  I have discussed with him the proper technique for the use  of his inhalers.  I will continue him on his other regimen of Symbicort,  Singulair, over-the-counter antihistamine, and Xolair.  I am concerned  that he may have some degree of sinus disease which may be  contributing  to his asthma symptoms as well.  I will therefore initiate him on  Veramyst 2 sprays in each nostril once daily.  I have instructed him on  the use of nasal irrigation.  I will also have him undergo a chest x-ray  today and call him with the results of this.  I then would plan on  following up with him in approximately 2-3 weeks to assess his symptom  status.  I have also asked him to monitor his peak flows at home and at  work to determine if there may be some correlation with his symptoms.     Coralyn Helling, MD  Electronically Signed    VS/MedQ  DD: 11/26/2006  DT: 11/26/2006  Job #: [id number]   cc:   Marjory Lies, M.D.

## 2010-10-16 NOTE — Assessment & Plan Note (Signed)
[facility]                             PULMONARY OFFICE NOTE   NAME:[name]                     MRN:          [id number]  DATE:01/12/2007                            DOB:          Feb 09, 1956    HISTORY OF PRESENT ILLNESS:  The patient is a 55 year old white male  patient of Dr. Thurston Hole who has a known history of COPD with an asthmatic  component with an FEV1 of 45% of the predicted, documented in December  of 2007.  The patient returns today for a 6-week followup and medication  review.  The patient has, since last visit, tapered off of prednisone  and continues on Symbicort 160/4.5 two puffs twice daily.  The patient  reports that he has done well since last visit without any flare of  symptoms.  The patient has brought all of his medications in today for  review which are current with our medication list.  The patient denies  any chest pain, increased shortness of breath, leg swelling, orthopnea,  PND.   PHYSICAL EXAMINATION:  The patient is a pleasant male in no acute  distress.  He is afebrile with stable vital signs.  HEENT:  Unremarkable.  NECK:  Supple without cervical adenopathy.  No JVD.  Lung sounds are diminished at the bases, otherwise clear.  CARDIAC:  Regular rate and rhythm.  ABDOMEN:  Soft and nontender.  EXTREMITIES:  Warm without any edema.   IMPRESSION AND PLAN:  1. Chronic obstructive pulmonary disease with an asthmatic component.      The patient will continue on Symbicort 2 puffs twice daily along      with Singulair daily.  The patient has done well off of steroids.      Will maintain off his systemic steroids for now, and will follow      back up with Dr. [name] as scheduled in 4 weeks or sooner if needed.  2. Complex medication regimen.  The patient's medications are reviewed      in detail.  Patient education was provided.  A computerized      medication calendar was completed for this patient and reviewed in   detail.      Rubye Oaks, NP  Electronically Signed      Charlaine Dalton. [name], MD, [facility]  Electronically Signed   TP/MedQ  DD: 01/14/2007  DT: 01/15/2007  Job #: [id number]

## 2010-10-16 NOTE — Assessment & Plan Note (Signed)
[facility]                             PULMONARY OFFICE NOTE   NAME:Schweickert, HIGINIO GROW                     MRN:          [id number]  DATE:12/04/2006                            DOB:          12-21-1955    PULMONARY EXTENDED ACUTE FOLLOWUP OFFICE EVALUATION:   HISTORY:  A 55 year old white male who is a remote smoker with a  baseline FEV1 of 45% predicted with a ratio of 36% documented in  December 2007, consistent with severe air flow obstruction.  Note that  he had marked truncation on inspiratory loop consistent with a component  of vocal cord dysfunction.  He carries a diagnosis of COPD with an  asthmatic component plus GERD chronically and he has been on Xolair  treatments as well for allergic rhinitis but now comes in worse since  the pollen season started (about 3 months, only better while on  prednisone, worsens each time it is tapered).  His main complaint is  complaint of congested cough with thick white mucus, dyspnea with any  activity over slow ADLs, and frequent nocturnal awakening and need for  Combivent or DuoNeb rescue therapy.   MEDICATIONS:  Reviewed in the column dated December 04, 2006, although I am  not sure he is consistent about using them.   PHYSICAL EXAMINATION:  GENERAL:  He is a non-Cushingoid appearing,  ambulatory, anxious white male in no acute distress.  VITAL SIGNS:  Afebrile with normal vital signs.  HEENT:  Unremarkable.  Oropharynx is clear.  LUNGS:  Reveal junky inspiratory and expiratory rhonchi with pseudo-  wheeze prominent.  HEART:  Regular rhythm without murmur, gallop, or rub.  ABDOMEN:  Soft, benign.  EXTREMITIES:  Warm without calf tenderness, cyanosis, clubbing, edema.   IMPRESSION:  Chronic obstructive pulmonary disease with a refractory  asthmatic component that appears to be steroid responsive.   The differential diagnoses include poorly controlled rhinitis,  sinusitis, allergies, and reflux.  I am  also not convinced that he  actually takes the complex medical regimen the way he reports he does.   In any case, I believe the following would be an appropriate empiric  regimen:  1. Continue Symbicort 160/4.5 two puffs b.i.d.  2. Continue off Spiriva, acknowledging that if he is better enough      once he is improved for a full activities of daily living, he does      not need additional bronchodilator but can use the p.r.n.      combination therapy with albuterol and Atrovent as he has done in      the past.  If we cannot optimize his function on prednisone to the      point where he is no longer limited by the ceiling imposed by COPD      on his maximum minute ventilation then I believe Spiriva needs to      be added back and albuterol p.r.n. should be the only medication      used for rescue.  3. For now, I recommend prednisone 20 mg per day until better, and  then 10 mg per day thereafter.  4. I have asked him to return in 2 weeks for a trust but verifyvisit      for full generation of medication calendar in this context of      medication reconciliation and then see me in 4 weeks with PFTs.  5. I also reviewed MDI technique with him which improved to nearly 90%      with coaching.     Charlaine Dalton. [name], MD, [facility]  Electronically Signed    MBW/MedQ  DD: 12/04/2006  DT: 12/04/2006  Job #: [id number]

## 2010-10-16 NOTE — H&P (Signed)
[name], [name]              ACCOUNT NO.:  [id number]   MEDICAL RECORD NO.:  [id number]          PATIENT TYPE:  EMS   LOCATION:  ED                           FACILITY:  [facility]   PHYSICIAN:  Coralyn Helling, MD        DATE OF BIRTH:  [birth year]   DATE OF ADMISSION:  10/30/2008  DATE OF DISCHARGE:                              HISTORY & PHYSICAL   CHIEF COMPLAINT:  Dyspnea.   BRIEF HISTORY:  This is a 55 year old white male patient followed by Dr.  Sandrea Hughs in the outpatient setting for a known history of chronic  obstructive pulmonary disease with significant asthmatic component.  He  was last seen in the pulmonary office on Oct 25, 2008 by Dr. Marchelle Gearing.  At that point presented for an acute visit reporting approximately 4 to  6 week complaint of worsening chest congestion, and shortness of breath.  Apparently this was also associated with increased wheezing and old  occasional chest discomfort associated with deep breath.  He was seen in  the pulmonary office, he also reported having self treated with slow  prednisone taper in the outpatient setting.  This apparently did give  him some temporal improvements.  Following his acute visit in the  office, he was prescribed at prescription of prednisone starting 60 mg  p.o. daily.  This was tapered down to 40 mg daily.  He presents to the  emergency room with no improvement from the above treatments.  He now  reports his dyspnea is significant even with any exertion.  He denies  significant cough, no rhinitis, no fever, no chest pain with an  exception of occasional tightness with cough and deep breath.  He has  had no leg swelling, no leg pain, no aches, no prolonged travel, no sick  exposure.  As mentioned he has had progressive dyspnea and wheeze.  He  presents to the emergency room at [facility] with these  constellations of findings, this chest x-ray was clear upon initial  evaluation without new infiltrates.  He is now  pending hospital  admission for failure to respond to outpatient therapy.   PAST MEDICAL HISTORY:  Chronic obstructive pulmonary disease with  significant asthmatic component.  His last pulmonary function tests were  measured during the visit with Dr. Marchelle Gearing showing a FVC of 48%  predicted, FEV-1/FVC ratio of 54% predicted with a FEV-1 of 25%  predicted.  Of note, this is during acute exacerbation.  He does have a  baseline FEV-1 recorded as 45% predicted in 2007.  Additional past  medical history includes gastroesophageal reflux disease.   FAMILY HISTORY:  Positive for chronic obstructive pulmonary disease and  asthma.   SOCIAL HISTORY:  He is a prior smoker, discontinued smoking 1999.   ALLERGIES:  Consist of PENICILLIN, NUTS, and multiple environmental  sensitivities.   CURRENT MEDICATION:  1. Advair HFA 230/20 one mcg packed actuation.  He takes 2 puffs of      this twice daily.  2. He is currently on a prednisone taper this is consisting of 40 mg  p.o. daily.  3. Additionally he takes Singulair 10 mg p.o. every night.  4. Protonix 40 mg daily.  5. Mucinex DM 30/600 one to two tablets every 12 hours.  6. Combivent 103/18 mcg inhaled 2 puffs every 4 to 6 hours as needed.  7. DuoNeb 0.55/2.5 nebs every 4 hours as needed.  8. Xolair 150 mg once monthly.  9. Zyrtec 10 mg p.o. q.h.s.   REVIEW OF SYSTEMS:  GENERAL:  Currently no acute distress.  HEENT:  Denies nasal congestion, headache, sore throat, rhinitis.  CHEST:  Positive for wheezing, positive for exertional dyspnea, positive for  chest discomfort with cough, and deep breath.  CARDIAC:  Denies chest  pain with activity, denies chest discomfort and denies pain radiation to  neck or arm, denies exertional chest discomfort.  ABDOMEN:  No nausea,  vomiting or diarrhea.  GU:  Denies any dysuria, frequency or hesitancy.  EXTREMITIES:  Are within normal limits.  NEUROLOGIC:  Within normal  limits.  ENDOCRINE:  Without  heat or cold intolerance.   PHYSICAL EXAM:  VITAL SIGNS:  Temperature 98.3, pulse rate 77,  respirations 22, blood pressure 118/71, saturations currently 94% on 2  liters nasal cannula.  GENERAL:  This is a well-developed 55 year old white male patient  currently sitting upright in medical stretcher in no acute distress,  able to speak full sentences.  HEENT:  He is normocephalic without JVD or adenopathy.  His phonation is  clear, his mucous membranes are moist.  PULMONARY:  Notable for prolonged expiratory wheezes throughout all lung  fields.  There is no current accessory muscle use.  CARDIAC:  Regular rate and rhythm.  ABDOMEN:  Soft, nontender.  EXTREMITIES:  Brisk cap refill, no edema.  2+ pulses.  GU: Voids spontaneously.  NEUROLOGIC:  Grossly intact.   LABORATORY DATA:  Total CO2 24, ionized calcium 1.25, hemoglobin 16.7,  hematocrit 29, sodium 138, potassium 4, chloride 103, glucose 267, BUN  16, creatinine 0.8, white blood cell count 11.3, hemoglobin 15.2,  hematocrit 45.1, platelet count 380,000, neutrophil 83% with absolute  neutrophil 9.3.   IMPRESSION AND PLAN:  Acute exacerbation of chronic for pulmonary  disease with significant asthmatic component.  [name] will be  admitted to a regular telemetry bed.  His treatment will consist of  supplemental oxygen as indicated, inhaled bronchodilators, IV systemic  steroids, empiric antibiotics, we will obtain urine strep Legionella and  sputum cultures and monitor over the next 24 to 48 hours.  Suspect that  [name] should improve in the next 48 hours, and the challenge at  that point will then become how to manage his asthmatic regimen in the  outpatient setting.  Of note, he was requiring in excess of 12 short  acting beta agonist actuation a day for breakthrough asthmatic symptoms.  Best practice, [name] will be given proton pump inhibitors for  stress ulcer prophylaxis, subcu heparin for DVT prophylaxis,  and sliding-  scale insulin for hyperglycemia in the setting of high dose systemic  steroids.  Additionally, we will send a DIC panel in the setting of  chest discomfort.  At this point doubtful that this is pulmonary  embolus.  However, if DIC panel elevated will give consideration for  further evaluation to rule out pulmonary embolus if chest discomfort  continues in spite of current therapeutic regimen.      Zenia Resides, NP      Coralyn Helling, MD  Electronically Signed   PB/MEDQ  D:  10/30/2008  T:  10/30/2008  Job:  [id number]

## 2010-10-19 NOTE — Assessment & Plan Note (Signed)
[facility]                             PULMONARY OFFICE NOTE   NAME:[name], [name]                     MRN:          [id number]  DATE:09/29/2006                            DOB:          April 08, 1956    HISTORY OF PRESENT ILLNESS:  The patient is a 56 year old white male,  patient of Dr. Marcos Eke who has a known history of chronic obstructive  asthma, presents today for a 1 week history of increased wheezing,  shortness of breath, minimally productive cough with clear thick mucus.  The patient denies any purulent sputum, fevers, chest pain, orthopnea,  PND or leg swelling.  The patient has had to increase his nebulizer use  over the last week to 2 to 3 times a day.   The patient is maintained on:  1. Xolair monthly.  2. Symbicort 160/4.5 two puffs twice daily.  3. Spiriva daily.   PAST MEDICAL HISTORY:  Is reviewed.   CURRENT MEDICATIONS:  Reviewed.   PHYSICAL EXAMINATION:  The patient is a pleasant male in no acute  distress.  He is afebrile with stable vital signs.  O2 saturation 92% on room air.  HEENT:  Nasal mucosa slightly pale.  Nontender sinuses.  Posterior  pharynx is clear.  NECK:  Supple without cervical adenopathy.  No JVD.  LUNGS:  Sounds reveal expiratory wheezes bilaterally.  CARDIAC:  Regular rate.  ABDOMEN:  Is soft and nontender.  EXTREMITIES:  Warm without any edema.   IMPRESSION/PLAN:  Acute asthmatic exacerbation.  The patient is to begin  a prednisone pack over the next week.  Add Mucinex DM twice daily. The  patient was given Xopenex nebulizer treatment in the office.  The  patient is to return back her as scheduled or sooner if needed.      Rubye Oaks, NP       C. Danice Goltz, MD    TP/MedQ  DD: 09/29/2006  DT: 09/29/2006  Job #: [id number]

## 2010-10-19 NOTE — Assessment & Plan Note (Signed)
[name]                             PULMONARY OFFICE NOTE   NAME:[name] [name] OSGOOD                     MRN:          [id number]  DATE:07/30/2006                            DOB:          1955-08-24    This is a very pleasant 55 year old gentleman who follows up on chronic  obstructive asthma.  The patient reports no complaints today.  During  his last couple of visits, he was switched over to Symbicort 160/4.5 two  inhalations twice a day, and is actually doing very well with this.  He  denies any other complaint.  He is totally off corticosteroids.   PHYSICAL EXAMINATION:  VITAL SIGNS:  Noted.  Oxygen saturation was 94%  on room air.  GENERAL:  This is a well-developed, well-nourished gentleman, who is in  no acute distress.  HEENT:  Examination is unremarkable.  NECK:  Supple.  No adenopathy noted.  No JVD.  LUNGS:  With end expiratory wheezes throughout, but he is moving air  fairly well.  CARDIAC EXAMINATION:  Regular rate and rhythm.  No rubs, murmurs or  gallops.  EXTREMITIES:  No cyanosis, no clubbing, no edema noted.   IMPRESSION:  Chronic obstructive asthma.  The patient is actually doing  fairly well, however, continues to have some difficulties with  bronchospasm.   PLAN:  1. Will be for the patient to continue Symbicort as is.  Continue      other medications as they are.  2. We will place the patient on Spiriva 1 inhalation daily.  3. Followup will be in 4 to 6 weeks' time with me or our nurse      practitioner, Tammy Parrett.  He is to contact us prior to that      time should any new problems arise.     Gailen Shelter, MD  Electronically Signed    CLG/MedQ  DD: 07/30/2006  DT: 07/30/2006  Job #: [id number]

## 2010-10-19 NOTE — Assessment & Plan Note (Signed)
[facility]                             PULMONARY OFFICE NOTE   NAME:[name], [name]                     MRN:          [id number]  DATE:09/08/2006                            DOB:          1956-01-02    This is a very pleasant 55 year old gentleman who follows here for  chronic obstructive asthma.  He is on Xolair therapy for IgE mediated  component of the same.  The patient has also had history of MAI in the  past by bronchoscopy performed in [place], [place].  This  presented in the form of lung nodules.  The patient was again diagnosed  at that time by Dr. Harriet [name] in [place], [place] prior to  2002.  The patient presents today for followup.  He actually has been  doing relatively well.  He voices no complaints.  He has had some  occasional tenacious secretions, otherwise unremarkable.  He denies any  fevers, chills, or sweats.  He has had no hemoptysis.   CURRENT MEDICATIONS:  As noted on the intake sheet.   PHYSICAL EXAMINATION:  VITAL SIGNS:  Noted.  Oxygen saturation is 96% on  room air.  GENERAL:  This is a well-developed and well-nourished gentleman who is  in no acute distress.  HEENT:  Unremarkable.  NECK:  Supple.  No adenopathy noted.  No JVD.  LUNGS:  Clear to auscultation bilaterally.  He is moving air very well.  CARDIAC:  Regular rate and rhythm.  No murmurs, rubs or gallops.  EXTREMITIES:  Patient has no clubbing, cyanosis or edema noted.   IMPRESSION:  1. Chronic obstructive asthma.  The patient is actually fairly well      compensated.  2. History of lung nodules and MAI, currently not an active issue.   PLAN:  1. The plan will be for the patient to continue medications as they      are, to include Xolair.  2. The patient wished to change his allergy medication due to lack of      effectiveness with the same.  We will give him a trial of Xyzal 5      mg daily.  3. Followup will be in 3-4 months time with  Dr. Coralyn [name], as the      patient will continue to follow up with Dr. Craige [name], as I will be      leaving the practice.     Gailen Shelter, MD  Electronically Signed    CLG/MedQ  DD: 09/08/2006  DT: 09/09/2006  Job #: [id number]

## 2010-10-19 NOTE — Assessment & Plan Note (Signed)
[facility]                               PULMONARY OFFICE NOTE   NAME:[name], [name]                     MRN:          [id number]  DATE:04/07/2006                            DOB:          1955/08/19    HISTORY OF PRESENT ILLNESS:  This is a 55 year old white male patient of Dr.  Jayme Cloud who has a known history of COPD and asthmatic bronchitis with an  asthmatic component, who presents for a 1 week history of nasal congestion,  wheezing, and cough.  The patient denies any hemoptysis, purulent sputum,  fever, recent travel, or antibiotic use.  The patient is currently  maintained on Qvar 80 mcg 2 puffs twice daily, DuoNeb nebulizer b.i.d.   PAST MEDICAL HISTORY:  Reviewed.   PHYSICAL EXAM:  The patient is a pleasant male in no acute distress.  He is afebrile with stable vital signs.  O2 saturation is 94% on room air.  HEENT:  Unremarkable.  NECK:  Supple without adenopathy.  No JVD.  LUNGS:  Sounds reveal some expiratory wheezes bilaterally.  CARDIAC:  A regular rate and rhythm.  ABDOMEN:  Soft and benign.  EXTREMITIES:  Warm without any edema.   IMPRESSION AND PLAN:  Acute asthmatic bronchitic exacerbation.  The patient  is to add in Mucinex DM twice daily.  The patient was given a Xopenex  nebulizer treatment in the office.  He is to begin a prednisone 10 mg #20  taper.  He will return here in 1 month with Dr. Jayme Cloud or sooner if  needed.     Rubye Oaks, NP  Electronically Signed    TP/MedQ  DD: 04/08/2006  DT: 04/08/2006  Job #: [id number]

## 2010-10-19 NOTE — Assessment & Plan Note (Signed)
[facility]                             PULMONARY OFFICE NOTE   NAME:[name], [name]                     MRN:          [id number]  DATE:05/05/2006                            DOB:          Feb 21, 1956    PULMONARY/EXTENDED FOLLOWUP OFFICE VISIT   HISTORY:  A 55 year old white male, remote smoke, who carries a  diagnosis of COPD with a significant asthmatic component and was seen by  our nurse practitioner in an attempt to consolidate and reconcile his  medications with no apparent impact.  The patient did not bring his  medicines back and still does not know the names of his medicines well.  I am not convinced he takes any of them as they are listed on the  medication face sheet, dated May 05, 2006.  I had given him  Symbicort in the hopes that he would use this before initiating rescue  therapy in the morning when he gets up but he has not followed this  instructed either (see comments below).   The one medicine that seems to make the most difference is prednisone  but he flares each time this is tapered.  Note also that he is on Xolair  and has been on monthly treatment since 2003.   PHYSICAL EXAMINATION:  GENERAL:  He is a pleasant, ambulatory white male  in no acute distress.  He is afebrile with normal vital signs.  HEENT:  Reveals minimal turbinate edema, oropharynx is clear.  LUNG FIELDS:  Reveal pan-expiratory wheeze.  HEART:  There is a respiratory rate without murmur, gallop or rub.  ABDOMEN:  Soft, benign.  EXTREMITIES:  Warm without calf tenderness, cyanosis, clubbing, or  edema.   IMPRESSION:  The patient has difficult-to-control asthma despite  treatment with topical steroids, leukotriene antagonist, PPI therapy,  and Xolair.  I am very suspicious this patient is actually not complaint  with the medicines either Dr. Jayme Cloud or I have recommended and  therefore I spent an extra 20 minutes with him today going line by line  over  a reasonable medication calendar that provides him both a user-  friendly and unambiguous way to administer his medicine, both the  maintenance medicines and his p.r.n.'s.   For now I recommended adding prednisone 10 mg tablets two q.a.m. until  100% back to baseline (which means no need for rescue beta-2 at all and  no nocturnal awakenings with full physical activity including sports)  and then 10 mg per day until we see him back here in the office.   I also reviewed with him optimal MDI technique for which he improved  from about 50% effectiveness up to 100% effectiveness with coaching.     Charlaine Dalton. [name], MD, [facility]  Electronically Signed    MBW/MedQ  DD: 05/05/2006  DT: 05/06/2006  Job #: [id number]   cc:   Marjory Lies, M.D.

## 2010-10-19 NOTE — Assessment & Plan Note (Signed)
[facility]                               PULMONARY OFFICE NOTE   NAME:[name], [name]                     MRN:          [id number]  DATE:04/16/2006                            DOB:          [birth year]    HISTORY:  55 year old white male who states he has carried the diagnosis of  asthma all his life and quit smoking in 1999 but now is maintained on Xolair  plus Qvar plus Advair plus DuoNeb and generally doing poorly since the  fall.  In addition to having shortness of breath with subjective wheezing  and frequent need for beta 2 rescue therapy, he required prednisone for an  exacerbation a week ago and says he is not quite back to normal.  He  continues to wake up at night wheezing with frequent need for Albuterol with  a congested cough of thick white mucous, no pleuritic pain, and obvious  reflux and sinus complaints.   I attempted to review his medications in an orderly fashion, but the patient  has no organization and became confused regarding times of day, before or  after meals, etc.   PHYSICAL EXAMINATION:  GENERAL:  He is a pleasant ambulatory white male in no acute distress.  He  is afebrile with stable vital signs.  Weight 232 pounds.  HEENT:  Unremarkable.  Oropharynx is clear.  LUNGS:  Fields clear pan-expiratory wheeze.  He is hyper-resonant to  percussion.  HEART:  Regular rate and rhythm without murmur, gallop or rub.  ABDOMEN:  Soft, benign.  EXTREMITIES:  Warm without calf tenderness, cyanosis, or clubbing.   IMPRESSION:  I had an extended discussion with this patient lasting 15-25  minutes on the following topics:  1. First, I spent extra time documenting that he can, in fact, MDI      technique effectively but needs to blow out all the way to residual      volume first to give him a large inspiratory capacity.  I recommended      that he switch to Symbicort 160, 4.5, 2 puffs b.i.d. and abandon all      other forms of  inhalers waiting 15 minutes after he uses Symbicort and      if he needs additional bronchodilators he can certainly use the      Albuterol 2 puffs q.4h. p.r.n.  However, if he is breaking the rule of      twos which I reviewed with him in detail, he needs to take a short      course of prednisone between now and when he returns in two weeks for      full medication reconciliation.  2. Chart review indicates that he has pulmonary nodules that need follow      up and were felt to be due to MAI.  A chest x-ray was ordered for      today.  3. I do not see that he has had an alpha-1 trypsin level and although he      does have a typical allergic phenotype and is Xolair dependent, I  have recommended that we check an alpha-1 level to be complete.   I have scheduled him to see Dr. Jayme [name] back for a set of PFTs in six weeks  after we do full medication reconciliation and generate a new medication  calendar as well as MAR for the chart on his next visit to see our nurse  practitioner in two weeks.     [name]. Travis Sires, MD, [facility]  Electronically Signed    MBW/MedQ  DD: 04/16/2006  DT: 04/16/2006  Job #: [id number]   cc:   [name], M.D.

## 2010-12-18 ENCOUNTER — Ambulatory Visit: Payer: Self-pay | Admitting: Internal Medicine

## 2010-12-19 ENCOUNTER — Encounter: Payer: Self-pay | Admitting: Internal Medicine

## 2010-12-20 ENCOUNTER — Ambulatory Visit (INDEPENDENT_AMBULATORY_CARE_PROVIDER_SITE_OTHER): Payer: BC Managed Care – PPO | Admitting: Internal Medicine

## 2010-12-20 ENCOUNTER — Encounter: Payer: Self-pay | Admitting: Internal Medicine

## 2010-12-20 ENCOUNTER — Ambulatory Visit (INDEPENDENT_AMBULATORY_CARE_PROVIDER_SITE_OTHER)
Admission: RE | Admit: 2010-12-20 | Discharge: 2010-12-20 | Disposition: A | Discharge status: Home / Self Care | Payer: BC Managed Care – PPO | Source: Ambulatory Visit | Attending: Internal Medicine | Admitting: Internal Medicine

## 2010-12-20 VITALS — BP 120/82 | HR 88 | Temp 98.0°F | Ht 69.0 in | Wt 175.0 lb

## 2010-12-20 DIAGNOSIS — J984 Other disorders of lung: Secondary | ICD-10-CM

## 2010-12-20 DIAGNOSIS — Z23 Encounter for immunization: Secondary | ICD-10-CM

## 2010-12-20 DIAGNOSIS — K219 Gastro-esophageal reflux disease without esophagitis: Secondary | ICD-10-CM

## 2010-12-20 DIAGNOSIS — J449 Chronic obstructive pulmonary disease, unspecified: Secondary | ICD-10-CM

## 2010-12-20 MED ORDER — BUDESONIDE-FORMOTEROL FUMARATE 160-4.5 MCG/ACT IN AERO: 2.0000 | INHALATION_SPRAY | Freq: Two times a day (BID) | RESPIRATORY_TRACT | Status: DC

## 2010-12-20 MED ORDER — PNEUMOCOCCAL VAC POLYVALENT 25 MCG/0.5ML IJ INJ: 0.5000 mL | INJECTION | Freq: Once | INTRAMUSCULAR | Status: DC

## 2010-12-20 MED ORDER — TIOTROPIUM BROMIDE MONOHYDRATE 18 MCG IN CAPS: 18.0000 ug | ORAL_CAPSULE | Freq: Every day | RESPIRATORY_TRACT | Status: DC

## 2010-12-20 NOTE — Progress Notes (Signed)
Subjective:     Patient ID: [name], male   DOB: [birth year], 55 y.o.   MRN: [id number]  HPI 48  yowm quit smoking in 1999 an FEV1 of 45% predicted, documented in December of 2007.    April 20, 2009 no limitations in breathing, no nocturnal resp co's, the best he's done in years. rec work on inhaler technique no change in rx.   Oct 10, 2009 Acute visit. Pt c/o tightness in chest, wheezing and cough since March 2011. Cough is mainly dry but sometimes produces clear sputum. has not been on symbicort/spiriva though is using lots of combivent. Spiriva 1 capsule each am, two good deep drags x one month trial  If breathing worsening,  If real short of short breath (needing prednisone) ok to use rescue of xopenex but goal is less twice weekly  Try off combivent  November 13, 2009  Ov cc  sore throat "comes and goes" x 2 wks. He states that the wheezing has resolved but still has some chest congestion and prod cough with thick clear sputum. Overall better while on spriva and as needed xopenex. rec Continue spiriva and symbicort  Take prednisone dose pack  When respiratory symptoms flare add protonix before supper also.   June 14, 2010 Dyspnea and cough- the same using xopenex sev times a week but not every day. overall doing much better and just taking ppi qam.   12/20/2010 ov/Varonica Siharath cc occ congestion better with mucinex than with xopenex using very rarely, no limitations or purulent secretions. Sleeping ok without nocturnal  or early am exac of resp c/o's or need for noct saba.     Pt denies any significant sore throat, dysphagia, itching, sneezing,  nasal congestion or excess/ purulent secretions,  fever, chills, sweats, unintended wt loss, pleuritic or exertional cp, hempoptysis, orthopnea pnd or leg swelling.    Also denies any obvious fluctuation of symptoms with weather or environmental changes or other aggravating or alleviating factors.      Allergies  1) ! Pcn   Past Medical  History:  GERD  COPD/AB................................................................................................Marland KitchenDr [name]  -quit smoking in 1999 . Smoked 1 pack a day for 20-25 years  - FEV1 of 45% of the predicted, documented in December of 2007.  - FEV1 48% November 10, 2008 with ratio 49  - Alpha1AT nl 10/26/2007  - HFA 75% November 10, 2008 > 90% April 20, 2009 > 100% Oct 10, 2009  - Xolair stopped after approx 3 years November 10, 2008  -Pneumovax 12/20/2010     Review of Systems     Objective:   Physical Exam  wt 180 Oct 10, 2009 > 182 November 13, 2009 > 188 June 14, 2010 > 12/20/2010  175 HEENT mild turbinate edema. Oropharynx no thrush or excess pnd or cobblestoning. No JVD or cervical adenopathy. Mild accessory muscle hypertrophy. Trachea midline, nl thryroid. Chest was hyperinflated by percussion with diminished breath sounds and moderate increased exp time without wheeze. Hoover sign positive at mid inspiration. Regular rate and rhythm without murmur gallop or rub or increase P2 or edema. Abd: no hsm, nl excursion. Ext warm without cyanosis or clubbing     cxr 12/20/2010  COPD/chronic bronchitic changes. Old granulomatous disease. No acute findings.    Assessment:         Plan:

## 2010-12-20 NOTE — Patient Instructions (Signed)
Work on perfecting  inhaler technique:  relax and gently blow all the way out then take a nice smooth deep breath back in, triggering the inhaler at same time you start breathing in.  Hold for up to 5 seconds if you can.  Rinse and gargle with water when done   If your mouth or throat starts to bother you,   I suggest you time the inhaler to your dental care and after using the inhaler(s) brush teeth and tongue with a baking soda containing toothpaste and when you rinse this out, gargle with it first to see if this helps your mouth and throat.     Please schedule a follow up visit in 6 months but call sooner if needed  

## 2010-12-20 NOTE — Assessment & Plan Note (Signed)
No further cxr's needed

## 2010-12-20 NOTE — Assessment & Plan Note (Addendum)
The proper method of use, as well as anticipated side effects, of this metered-dose inhaler are discussed and demonstrated to the patient. Improved to 75% with coaching - using CFC technique on hfa inhaler   Each maintenance medication was reviewed in detail including most importantly the difference between maintenance and as needed and under what circumstances the prns are to be used.  Please see instructions for details which were reviewed in writing and the patient given a copy.

## 2010-12-20 NOTE — Assessment & Plan Note (Signed)
Adequate control on present rx, reviewed  

## 2011-03-04 ENCOUNTER — Encounter: Payer: BC Managed Care – PPO | Attending: Family Medicine | Admitting: *Deleted

## 2011-03-04 ENCOUNTER — Encounter: Payer: Self-pay | Admitting: *Deleted

## 2011-03-04 DIAGNOSIS — Z713 Dietary counseling and surveillance: Secondary | ICD-10-CM | POA: Insufficient documentation

## 2011-03-04 DIAGNOSIS — E119 Type 2 diabetes mellitus without complications: Secondary | ICD-10-CM | POA: Insufficient documentation

## 2011-03-04 NOTE — Patient Instructions (Addendum)
Goals:  Follow Diabetes Meal Plan (yellow card) as instructed.  Eat 3 meals and 2 snacks daily - Avoid meal skipping.  Add lean protein foods to all meals/snacks.  Aim for 30 grams of fiber daily.  Limit alcohol intake.  Aim for 60 mins of physical activity most days.

## 2011-03-04 NOTE — Progress Notes (Signed)
Medical Nutrition Therapy:  Appt start time: 0830 end time:  0930.  Assessment:  Primary concerns today: T2DM.  Pt with recent A1c of 6.6% (01/19/11) and new dx of T2DM here for assessment. Not checking BG (per MD) and daily meal skipping noted. Consumes ~2 drinks daily and continues daily use of smokeless tobacco.  Excessive CHO noted (daily coffee with sugar and tea sweetened with honey). Pt reports he is very active almost every day doing heavy yard work and [employer] often. Reports his wife is "very into nutrition" and is very supportive.  MEDICATIONS:  . budesonide-formoterol (SYMBICORT) 160-4.5 MCG/ACT inhaler  . cetirizine (ZYRTEC) 10 MG tablet  . dextromethorphan-guaiFENesin (MUCINEX DM) 30-600 MG per 12 hr tablet  . ipratropium-albuterol (DUONEB) 0.5-2.5 (3) MG/3ML SOLN  . levalbuterol (XOPENEX HFA) 45 MCG/ACT inhaler  . Multiple Vitamin (MULTIVITAMIN) capsule  . pantoprazole (PROTONIX) 40 MG tablet  . tiotropium (SPIRIVA) 18 MCG inhalation capsule    DIETARY INTAKE:  Usual eating pattern includes 2 meals and 1 snack per day.  24-hr recall: ("work day") B ( AM): egg omelette w/ egg beaters - ham, peppers, cheese  Snk ( AM): none  L ( PM): brown rice (1 c), black beans (1/2 c), (1 c) chicken, diced tomatoes, peppers OR [place] salad from [employer] Snk ( PM): none D ( PM): none Snk ( PM): pig skins, 2 white russians Beverages: tea sweetened w/ honey (24-48 oz), 2 cups coffee (w/ 2 tsp ea)  Usual physical activity:  kayaking, fishing, walks up to 5 miles/day at work, 2x week - walks 1 mile, heavy yard work    Estimated energy needs: 1900-2000 calories (pt requested maintenance calories) 215-225 g carbohydrates 130 g protein 50-60 g fat  Nutritional Diagnosis:  [place]-2.1 Impaired nutrient utilization related to excessive CHO intake as evidenced by a recent A1c of 6.6% and new diagnosis of T2DM.    Intervention/Goals:  Follow Diabetes Meal Plan (yellow card) as  instructed.  Eat 3 meals and 2 snacks daily - Avoid meal skipping.  Add lean protein foods to all meals/snacks.  Aim for 30 grams of fiber daily.  Limit alcohol intake.  Aim for 60 mins of physical activity most days.  Monitoring/Evaluation:  Dietary intake, exercise, A1c, and body weight in 2 month(s).

## 2011-03-07 ENCOUNTER — Encounter: Payer: BC Managed Care – PPO | Admitting: Dietician

## 2011-03-07 NOTE — Progress Notes (Signed)
  Medical Nutrition Therapy:  Appt start time: 0800 end time:  8:20  Assessment:  Primary concerns today: Comes in for blood glucose monitoring.  Has previously been counseled regarding the other diabetes self-management measures.  He has [employer] as his Health visitor.  Provided a One Touch Ultra Mini blood glucose meter kit.  Review ed the steps for blood glucose testing and meter use and care.  Reviewed the ADA recommendations for blood glucose readings.  He completed a return demonstration for glucose monitoring.  Following starting breakfast at 6:30 AM, his glucose at 8:17 was 137 mg/dl.   He will be contacting his MD for an order for strips and lancets.  He is also going to explore how much the strips and lancets will cost using [employer].  Provide him with a brochure for [facility] Order should he find that this would prove to the less expensive.  He [place] to monitor his blood glucose at least 1 time per day.  Start with fasting levels for 3-4 weeks and then vary testing times during the day.  Look to check following the largest meal of the day, 2 hours after the first bite.  Record the readings and take the meter and blood log to each MD or RD appointment.  Progress Towards Goal(s):  In progress.   Nutritional Diagnosis:  [employer]-2.1 Inpaired nutrition utilization As related to glucose.  As evidenced by recent diagnosis of type 2 diabetes..    Intervention:  Nutrition Covered at a recent appointment.  Handouts given during visit include:  One Touch Ultra Mini [id number] X Expiration 09/2011  Handout Obtaining Blood Glucose Control  Monitoring/Evaluation:  Dietary intake, exercise, blood glucose levels, and body weight as previously established with his last appointment.

## 2011-10-16 ENCOUNTER — Ambulatory Visit (INDEPENDENT_AMBULATORY_CARE_PROVIDER_SITE_OTHER): Payer: BC Managed Care – PPO | Admitting: Internal Medicine

## 2011-10-16 ENCOUNTER — Encounter: Payer: Self-pay | Admitting: Internal Medicine

## 2011-10-16 VITALS — BP 118/68 | HR 91 | Temp 98.0°F | Ht 69.5 in | Wt 161.0 lb

## 2011-10-16 DIAGNOSIS — J449 Chronic obstructive pulmonary disease, unspecified: Secondary | ICD-10-CM

## 2011-10-16 MED ORDER — LEVALBUTEROL TARTRATE 45 MCG/ACT IN AERO: 1.0000 | INHALATION_SPRAY | RESPIRATORY_TRACT | Status: DC | PRN

## 2011-10-16 MED ORDER — PREDNISONE (PAK) 10 MG PO TABS: ORAL_TABLET | ORAL | Status: AC

## 2011-10-16 NOTE — Assessment & Plan Note (Addendum)
-  quit smoking in 1999 . Smoked 1 pack a day for 20-25 years  - FEV1 of 45% of the predicted, documented in December of 2007.  - FEV1 48% November 10, 2008 with ratio 49  - Alpha1AT nl 10/26/2007  - HFA 90%  10/16/2011  - Xolair stopped after approx 3 years November 10, 2008  The proper method of use, as well as anticipated side effects, of a metered-dose inhaler are discussed and demonstrated to the patient. Improved effectiveness after extensive coaching during this visit to a level of approximately  90%  Mild flare assoc with seasonal rhinitis not all that responsive to zyrtec, best choice > rec prednisone dose pack

## 2011-10-16 NOTE — Progress Notes (Signed)
Subjective:     Patient ID: [name], male   DOB: September 18, 1955   MRN: [id number]  Brief patient profile:  56  yowm quit smoking in 1999 an FEV1 of 45% predicted, documented in December of 2007.   HPI April 20, 2009 no limitations in breathing, no nocturnal resp co's, the best he's done in years. rec work on inhaler technique no change in rx.   Oct 10, 2009 Acute visit. Pt c/o tightness in chest, wheezing and cough since March 2011. Cough is mainly dry but sometimes produces clear sputum. has not been on symbicort/spiriva though is using lots of combivent. Spiriva 1 capsule each am, two good deep drags x one month trial  If breathing worsening,  If real short of short breath (needing prednisone) ok to use rescue of xopenex but goal is less twice weekly  Try off combivent  November 13, 2009  Ov cc  sore throat "comes and goes" x 2 wks. He states that the wheezing has resolved but still has some chest congestion and prod cough with thick clear sputum. Overall better while on spriva and as needed xopenex. rec Continue spiriva and symbicort  Take prednisone dose pack  When respiratory symptoms flare add protonix before supper also.   June 14, 2010 Dyspnea and cough- the same using xopenex sev times a week but not every day. overall doing much better and just taking ppi qam.   12/20/2010 ov/[name] cc occ congestion better with mucinex than with xopenex using very rarely, no limitations or purulent secretions. Sleeping ok without nocturnal  or early am exac of resp c/o's or need for noct saba.  rec Work on Musician technique   10/16/2011 f/u ov/[name]  cc one week gradual onset slt  worse doe  attributed to pollen, not using xopenex at all despite worse sob, gen chest tightness mild itching sneezing and nasal congestion better on zyrtec.  Pt denies any significant sore throat, dysphagia,  excess/ purulent secretions,  fever, chills, sweats, unintended wt loss, pleuritic or exertional  cp, hempoptysis, orthopnea pnd or leg swelling.    Also denies any obvious fluctuation of symptoms with weather or environmental changes or other aggravating or alleviating factors.      Allergies  1) ! Pcn   Past Medical History:  GERD  COPD/AB................................................................................................Marland KitchenDr [name]  -quit smoking in 1999 . Smoked 1 pack a day for 20-25 years  - FEV1 of 45% of the predicted, documented in December of 2007.  - FEV1 48% November 10, 2008 with ratio 49  - Alpha1AT nl 10/26/2007  - HFA 75% November 10, 2008 > 90% April 20, 2009 > 100% Oct 10, 2009  - Xolair stopped after approx 3 years November 10, 2008  -Pneumovax 12/20/2010           Objective:   Physical Exam  wt 180 Oct 10, 2009 > 182 November 13, 2009 > 188 June 14, 2010 > 12/20/2010  175 HEENT mild turbinate edema. Oropharynx no thrush or excess pnd or cobblestoning. No JVD or cervical adenopathy. Mild accessory muscle hypertrophy. Trachea midline, nl thryroid. Chest was hyperinflated by percussion with diminished breath sounds and moderate increased exp time without wheeze. Hoover sign positive at mid inspiration. Regular rate and rhythm without murmur gallop or rub or increase P2 or edema. Abd: no hsm, nl excursion. Ext warm without cyanosis or clubbing     cxr 12/20/2010  COPD/chronic bronchitic changes. Old granulomatous disease. No acute findings.    Assessment:  Plan:

## 2011-10-16 NOTE — Patient Instructions (Signed)
Prednisone 10 mg take  4 each am x 2 days,   2 each am x 2 days,  1 each am x2days and stop  Always remember when respiratory symptoms flare to use the prilosec before bfast and supper.

## 2011-11-20 ENCOUNTER — Other Ambulatory Visit: Payer: Self-pay | Admitting: Internal Medicine

## 2012-02-25 ENCOUNTER — Other Ambulatory Visit: Payer: Self-pay | Admitting: Internal Medicine

## 2012-03-02 ENCOUNTER — Telehealth: Payer: Self-pay | Admitting: Internal Medicine

## 2012-03-02 MED ORDER — BUDESONIDE-FORMOTEROL FUMARATE 160-4.5 MCG/ACT IN AERO: 2.0000 | INHALATION_SPRAY | Freq: Two times a day (BID) | RESPIRATORY_TRACT | Status: DC

## 2012-03-02 NOTE — Telephone Encounter (Signed)
Pt aware rx for symbicort has been sent. Nothing further was needed

## 2012-04-06 ENCOUNTER — Other Ambulatory Visit: Payer: Self-pay | Admitting: Internal Medicine

## 2012-07-12 ENCOUNTER — Other Ambulatory Visit: Payer: Self-pay | Admitting: Internal Medicine

## 2012-10-21 ENCOUNTER — Encounter: Payer: Self-pay | Admitting: Internal Medicine

## 2012-10-21 ENCOUNTER — Ambulatory Visit (INDEPENDENT_AMBULATORY_CARE_PROVIDER_SITE_OTHER): Payer: BC Managed Care – PPO | Admitting: Internal Medicine

## 2012-10-21 VITALS — BP 100/72 | HR 89 | Temp 98.1°F | Ht 68.5 in | Wt 163.0 lb

## 2012-10-21 DIAGNOSIS — J449 Chronic obstructive pulmonary disease, unspecified: Secondary | ICD-10-CM

## 2012-10-21 MED ORDER — ALBUTEROL SULFATE HFA 108 (90 BASE) MCG/ACT IN AERS: 2.0000 | INHALATION_SPRAY | Freq: Four times a day (QID) | RESPIRATORY_TRACT | Status: DC | PRN

## 2012-10-21 MED ORDER — BUDESONIDE-FORMOTEROL FUMARATE 160-4.5 MCG/ACT IN AERO: 2.0000 | INHALATION_SPRAY | Freq: Two times a day (BID) | RESPIRATORY_TRACT | Status: DC

## 2012-10-21 MED ORDER — PREDNISONE (PAK) 10 MG PO TABS: ORAL_TABLET | ORAL | Status: DC

## 2012-10-21 NOTE — Patient Instructions (Addendum)
Prednisone 10 mg take  4 each am x 2 days,   2 each am x 2 days,  1 each am x2days and stop  Always remember when respiratory symptoms flare to use the prilosec before bfast and supper.  Zyrtec 10 one at bedtime as needed for itching sneezing or nasal congestion   Only use your albuterol (proaire) as a rescue medication to be used if you can't catch your breath by resting or doing a relaxed purse lip breathing pattern. The less you use it, the better it will work when you need it.      If you are satisfied with your treatment plan let your doctor know and he/she can either refill your medications or you can return here when your prescription runs out.     If in any way you are not 100% satisfied,  please tell us.  If 100% better, tell your friends!

## 2012-10-21 NOTE — Assessment & Plan Note (Addendum)
-  quit smoking in 1999 . Smoked 1 pack a day for 20-25 years  - FEV1 of 45% of the predicted, documented in December of 2007.  - FEV1 48% November 10, 2008 with ratio 49  - Alpha1AT nl 10/26/2007  - HFA 90%  10/16/2011  - Xolair stopped after approx 3 years November 10, 2008  Mild flare assoc with non-specific rhinitis > rec prednisone dose pack with zyrtec for rhinitis and max gerd rx as prev recommended just for the duration of the flare and then back to daily maint rx which has helped controlled symptoms over the last year    Each maintenance medication was reviewed in detail including most importantly the difference between maintenance and as needed and under what circumstances the prns are to be used.  Please see instructions for details which were reviewed in writing and the patient given a copy.

## 2012-10-21 NOTE — Progress Notes (Signed)
Subjective:     Patient ID: [name], male   DOB: Oct 26, 1955   MRN: [id number]  Brief patient profile:  57  yowm quit smoking in 1999 an FEV1 of 45% predicted, documented in December of 2007.   HPI April 20, 2009 no limitations in breathing, no nocturnal resp co's, the best he's done in years. rec work on inhaler technique no change in rx.   Oct 10, 2009 Acute visit. Pt c/o tightness in chest, wheezing and cough since March 2011. Cough is mainly dry but sometimes produces clear sputum. has not been on symbicort/spiriva though is using lots of combivent. Spiriva 1 capsule each am, two good deep drags x one month trial  If breathing worsening,  If real short of short breath (needing prednisone) ok to use rescue of xopenex but goal is less twice weekly  Try off combivent  November 13, 2009  Ov cc  sore throat "comes and goes" x 2 wks. He states that the wheezing has resolved but still has some chest congestion and prod cough with thick clear sputum. Overall better while on spriva and as needed xopenex. rec Continue spiriva and symbicort  Take prednisone dose pack  When respiratory symptoms flare add protonix before supper also.   June 14, 2010 Dyspnea and cough- the same using xopenex sev times a week but not every day. overall doing much better and just taking ppi qam.   12/20/2010 ov/Damiana Berrian cc occ congestion better with mucinex than with xopenex using very rarely, no limitations or purulent secretions. Sleeping ok without nocturnal  or early am exac of resp c/o's or need for noct saba.  rec Work on Musician technique   10/16/2011 f/u ov/Ceclia Koker  cc one week gradual onset slt  worse doe  attributed to pollen, not using xopenex at all despite worse sob, gen chest tightness mild itching sneezing and nasal congestion better on zyrtec. rec Prednisone 10 mg take  4 each am x 2 days,   2 each am x 2 days,  1 each am x2days and stop Always remember when respiratory symptoms flare to  use the prilosec before bfast and supper  10/21/2012 f/u ov/Antwion Carpenter  Chief Complaint  Patient presents with  . Acute Visit    Pt c/o increased and chest tightness for the past couple of months. He also has a prod cough with minimal cleat sputum. Has been using rescue inhaler 4 times per day for the past few days.   no purulent sputum, no noct flares, started with some ear and sinus congestion, did not double the prilosec as prev recs.      Sleeping ok without nocturnal  or early am exacerbation  of respiratory  c/o's or need for noct saba. Also denies any obvious fluctuation of symptoms with weather or environmental changes or other aggravating or alleviating factors except as outlined above   Current Medications,  Past Surgical History, Family History, and Social History were reviewed in [employer] record.  ROS  The following are not active complaints unless bolded sore throat, dysphagia, dental problems, itching, sneezing,  nasal congestion or excess/ purulent secretions, ear ache,   fever, chills, sweats, unintended wt loss, pleuritic or exertional cp, hemoptysis,  orthopnea pnd or leg swelling, presyncope, palpitations, heartburn, abdominal pain, anorexia, nausea, vomiting, diarrhea  or change in bowel or urinary habits, change in stools or urine, dysuria,hematuria,  rash, arthralgias, visual complaints, headache, numbness weakness or ataxia or problems with walking or coordination,  change in mood/affect or memory.       Allergies  1) ! Pcn   Past Medical History:  GERD  COPD/AB................................................................................................Marland KitchenDr [name]  -quit smoking in 1999 . Smoked 1 pack a day for 20-25 years  - FEV1 of 45% of the predicted, documented in December of 2007.  - FEV1 48% November 10, 2008 with ratio 49  - Alpha1AT nl 10/26/2007  - HFA 75% November 10, 2008 > 90% April 20, 2009 > 100% Oct 10, 2009  - Xolair stopped after  approx 3 years November 10, 2008  -Pneumovax 12/20/2010           Objective:   Physical Exam  wt 180 Oct 10, 2009 > 182 November 13, 2009 > 188 June 14, 2010 > 12/20/2010  175 > 163 10/21/2012  HEENT mild turbinate edema. Oropharynx no thrush or excess pnd or cobblestoning. No JVD or cervical adenopathy. Mild accessory muscle hypertrophy. Trachea midline, nl thryroid. Chest was hyperinflated by percussion with diminished breath sounds and moderate increased exp time with trace mid to late bilateral exp wheeze. Hoover sign positive at mid inspiration. Regular rate and rhythm without murmur gallop or rub or increase P2 or edema. Abd: no hsm, nl excursion. Ext warm without cyanosis or clubbing     cxr 12/20/2010  COPD/chronic bronchitic changes. Old granulomatous disease. No acute findings.    Assessment:         Plan:

## 2013-03-31 ENCOUNTER — Encounter: Payer: Self-pay | Admitting: Internal Medicine

## 2013-03-31 ENCOUNTER — Ambulatory Visit (INDEPENDENT_AMBULATORY_CARE_PROVIDER_SITE_OTHER): Payer: BC Managed Care – PPO | Admitting: Internal Medicine

## 2013-03-31 VITALS — BP 138/70 | HR 88 | Temp 98.0°F | Ht 69.0 in | Wt 173.0 lb

## 2013-03-31 DIAGNOSIS — J449 Chronic obstructive pulmonary disease, unspecified: Secondary | ICD-10-CM

## 2013-03-31 MED ORDER — PREDNISONE (PAK) 10 MG PO TABS: ORAL_TABLET | ORAL | Status: DC

## 2013-03-31 MED ORDER — OMEPRAZOLE 20 MG PO CPDR: DELAYED_RELEASE_CAPSULE | ORAL | Status: DC

## 2013-03-31 NOTE — Assessment & Plan Note (Signed)
-  quit smoking in 1999 . Smoked 1 pack a day for 20-25 years  - FEV1 of 45% of the predicted, documented in December of 2007.  - FEV1 48% November 10, 2008 with ratio 49  - Alpha1AT nl 10/26/2007  - HFA 90%  10/16/2011  - Xolair stopped after approx 3 years November 10, 2008   Acute flare in setting of fall rhinitis vs uri  Explained natural history of uri and why it's necessary in patients at risk to treat GERD aggressively  at least  short term (ppi Take 30- 60 min before your first and last meals of the day )  to reduce risk of evolving cyclical cough initially  triggered by epithelial injury and a heightened sensitivty to the effects of any upper airway irritants,  most importantly acid - related.  That is, the more sensitive the epithelium damaged for virus, the more the cough, the more the secondary reflux (especially in those prone to reflux) the more the irritation of the sensitive mucosa and so on in a cyclical pattern.   In meantime rx with prednisone x 6 days and reviewed approp use of prns    Each maintenance medication was reviewed in detail including most importantly the difference between maintenance and as needed and under what circumstances the prns are to be used.  Please see instructions for details which were reviewed in writing and the patient given a copy.

## 2013-03-31 NOTE — Progress Notes (Signed)
Subjective:     Patient ID: [name], male   DOB: [birth year]   MRN: [id number]  Brief patient profile:  57  yowm quit smoking in 1999 an FEV1 of 45% predicted, documented in December of 2007.   HPI April 20, 2009 no limitations in breathing, no nocturnal resp co's, the best he's done in years. rec work on inhaler technique no change in rx.     10/21/2012 f/u ov/Maddux First  Chief Complaint  Patient presents with  . Acute Visit    Pt c/o increased and chest tightness for the past couple of months. He also has a prod cough with minimal cleat sputum. Has been using rescue inhaler 4 times per day for the past few days.   no purulent sputum, no noct flares, started with some ear and sinus congestion, did not double the prilosec as prev recs.     rec Prednisone 10 mg take  4 each am x 2 days,   2 each am x 2 days,  1 each am x2days and stop  Always remember when respiratory symptoms flare to use the prilosec before bfast and supper. Zyrtec 10 one at bedtime as needed for itching sneezing or nasal congestion     03/31/2013 f/u ov/Dalylah Ramey re:  COPD  Chief Complaint  Patient presents with  . Acute Visit    Pt states developed head cold 2 wks ago and 1 wk ago started having increased SOB, chest congestion and prod cough with minimal clear sputum. Using proair twice per day.      Had previously reduced to rare saba and now at least twice daily but not typically while sleeping. Has mucinex dm rec from last avs but not using it for cough as per action plan. No purulent sputum    No obvious day to day or daytime variabilty or assoc cp or chest tightness, subjective wheeze overt sinus or hb symptoms. No unusual exp hx or h/o childhood pna/ asthma or knowledge of premature birth.  Sleeping ok without nocturnal  or early am exacerbation  of respiratory  c/o's or need for noct saba. Also denies any obvious fluctuation of symptoms with weather or environmental changes or other aggravating or alleviating  factors except as outlined above   Current Medications, Allergies, Complete Past Medical History, Past Surgical History, Family History, and Social History were reviewed in [employer] record.  ROS  The following are not active complaints unless bolded sore throat, dysphagia, dental problems, itching, sneezing,  nasal congestion or excess/ purulent secretions, ear ache,   fever, chills, sweats, unintended wt loss, pleuritic or exertional cp, hemoptysis,  orthopnea pnd or leg swelling, presyncope, palpitations, heartburn, abdominal pain, anorexia, nausea, vomiting, diarrhea  or change in bowel or urinary habits, change in stools or urine, dysuria,hematuria,  rash, arthralgias, visual complaints, headache, numbness weakness or ataxia or problems with walking or coordination,  change in mood/affect or memory.         Past Medical History:  GERD  COPD/AB................................................................................................Marland KitchenDr [name]  -quit smoking in 1999 . Smoked 1 pack a day for 20-25 years  - FEV1 of 45% of the predicted, documented in December of 2007.  - FEV1 48% November 10, 2008 with ratio 49  - Alpha1AT nl 10/26/2007  - HFA 75% November 10, 2008 > 90% April 20, 2009 > 100% Oct 10, 2009  - Xolair stopped after approx 3 years November 10, 2008  -Pneumovax 12/20/2010  Objective:   Physical Exam  wt 180 Oct 10, 2009 > 182 November 13, 2009 > 188 June 14, 2010 > 12/20/2010  175 > 163 10/21/2012 > 03/31/2013  174  HEENT mild turbinate edema. Oropharynx no thrush or excess pnd or cobblestoning. No JVD or cervical adenopathy. Mild accessory muscle hypertrophy. Trachea midline, nl thryroid. Chest was hyperinflated by percussion with diminished breath sounds and moderate increased exp time with trace mid   bilateral exp wheezes. Hoover sign positive at mid inspiration. Regular rate and rhythm without murmur gallop or rub or increase P2 or edema. Abd: no  hsm, nl excursion. Ext warm without cyanosis or clubbing     cxr 12/20/2010  COPD/chronic bronchitic changes. Old granulomatous disease. No acute findings.    Assessment:

## 2013-03-31 NOTE — Patient Instructions (Addendum)
Prednisone 10 mg take  4 each am x 2 days,   2 each am x 2 days,  1 each am x 2 days and stop   For cough mucinex dm 1200 mg every 12 hours   When flare with respiratory problems prilosec 20 mg Take 30- 60 min before your first and last meals of the day   Please schedule a follow up visit in 3 months but call sooner if needed with pfts

## 2013-04-08 ENCOUNTER — Telehealth: Payer: Self-pay | Admitting: Internal Medicine

## 2013-04-08 MED ORDER — TIOTROPIUM BROMIDE MONOHYDRATE 18 MCG IN CAPS: ORAL_CAPSULE | RESPIRATORY_TRACT | Status: DC

## 2013-04-08 NOTE — Telephone Encounter (Signed)
Refill sent and pt spouse is aware. Carron Curie, CMA

## 2013-10-22 ENCOUNTER — Other Ambulatory Visit: Payer: Self-pay | Admitting: Internal Medicine

## 2013-10-28 ENCOUNTER — Telehealth: Payer: Self-pay | Admitting: Internal Medicine

## 2013-10-28 MED ORDER — ALBUTEROL SULFATE HFA 108 (90 BASE) MCG/ACT IN AERS: INHALATION_SPRAY | RESPIRATORY_TRACT | Status: DC

## 2013-10-28 MED ORDER — OMEPRAZOLE 20 MG PO CPDR: DELAYED_RELEASE_CAPSULE | ORAL | Status: DC

## 2013-10-28 MED ORDER — BUDESONIDE-FORMOTEROL FUMARATE 160-4.5 MCG/ACT IN AERO
2.0000 | INHALATION_SPRAY | Freq: Two times a day (BID) | RESPIRATORY_TRACT | Status: DC
Start: 2013-10-28 — End: 2015-01-06

## 2013-10-28 NOTE — Telephone Encounter (Signed)
LMTC x 1 for [name].  Pt due for f/u with Dr [name].  Refills were sent to [facility] for Symbicort and Prilosec.  Med list has pt on Proair.  Is he taking this of Combivent?

## 2013-10-28 NOTE — Telephone Encounter (Signed)
Spoke with Eber Jones, pt's wife.  Ok to send rx for [employer].  Appt scheduled with Dr [name]

## 2013-11-01 ENCOUNTER — Ambulatory Visit (INDEPENDENT_AMBULATORY_CARE_PROVIDER_SITE_OTHER): Payer: BC Managed Care – PPO | Admitting: Internal Medicine

## 2013-11-01 ENCOUNTER — Encounter: Payer: Self-pay | Admitting: Internal Medicine

## 2013-11-01 VITALS — BP 102/60 | HR 88 | Temp 98.0°F | Ht 69.0 in | Wt 170.0 lb

## 2013-11-01 DIAGNOSIS — J449 Chronic obstructive pulmonary disease, unspecified: Secondary | ICD-10-CM

## 2013-11-01 MED ORDER — TIOTROPIUM BROMIDE MONOHYDRATE 2.5 MCG/ACT IN AERS: INHALATION_SPRAY | RESPIRATORY_TRACT | Status: DC

## 2013-11-01 NOTE — Patient Instructions (Addendum)
Try spiriva respimat 2 puffs each am vs handhaler(powder) and see if you like it better and call us with your preference in one week so we can get you a year supply  No change on symbicort 160 Take 2 puffs first thing in am and then another 2 puffs about 12 hours later.   Only use your albuterol as a rescue medication to be used if you can't catch your breath by resting or doing a relaxed purse lip breathing pattern.  - The less you use it, the better it will work when you need it. - Ok to use up to 2 puffs  every 4 hours if you must but call for immediate appointment if use goes up over your usual need - Don't leave home without it !!  (think of it like the spare tire for your car)   Please schedule a follow up visit in 12 months but call sooner if needed

## 2013-11-03 NOTE — Assessment & Plan Note (Signed)
-  quit smoking in 1999 . Smoked 1 pack a day for 20-25 years  - FEV1 of 45% of the predicted, documented in December of 2007.  - FEV1 48% November 10, 2008 with ratio 49  - Alpha1AT nl 10/25/2008 nl  - HFA 90%  10/16/2011   - Xolair stopped after approx 3 years November 10, 2008> no benefit   Overall doing well but may benefit from change to spiriva respimat with less mouth irritation and systemic effects  The proper method of use, as well as anticipated side effects, of a metered-dose inhaler are discussed and demonstrated to the patient. Improved effectiveness after extensive coaching during this visit to a level of approximately  90% so given 2 week trial sample    Each maintenance medication was reviewed in detail including most importantly the difference between maintenance and as needed and under what circumstances the prns are to be used.  Please see instructions for details which were reviewed in writing and the patient given a copy.

## 2013-11-03 NOTE — Progress Notes (Signed)
Subjective:    Patient ID: [name], male   DOB: January 17, 1956   MRN: [id number]   Brief patient profile:  58 yowm quit smoking in 1999 an FEV1 of 45% predicted, documented in December of 2007 c/w GOLD III with a lot of reversibility  HPI April 20, 2009 no limitations in breathing, no nocturnal resp co's, the best he's done in years. rec work on inhaler technique no change in rx.     10/21/2012 f/u ov/[name]  Chief Complaint  Patient presents with  . Acute Visit    Pt c/o increased and chest tightness for the past couple of months. He also has a prod cough with minimal cleat sputum. Has been using rescue inhaler 4 times per day for the past few days.   no purulent sputum, no noct flares, started with some ear and sinus congestion, did not double the prilosec as prev recs.     rec Prednisone 10 mg take  4 each am x 2 days,   2 each am x 2 days,  1 each am x2days and stop  Always remember when respiratory symptoms flare to use the prilosec before bfast and supper. Zyrtec 10 one at bedtime as needed for itching sneezing or nasal congestion     03/31/2013 f/u ov/[name] re:  COPD  Chief Complaint  Patient presents with  . Acute Visit    Pt states developed head cold 2 wks ago and 1 wk ago started having increased SOB, chest congestion and prod cough with minimal clear sputum. Using proair twice per day.   Had previously reduced to rare saba and now at least twice daily but not typically while sleeping. Has mucinex dm rec from last avs but not using it for cough as per action plan. No purulent sputum  rec Prednisone 10 mg take  4 each am x 2 days,   2 each am x 2 days,  1 each am x 2 days and stop  For cough mucinex dm 1200 mg every 12 hours  When flare with respiratory problems prilosec 20 mg Take 30- 60 min before your first and last meals of the day  Please schedule a follow up visit in 3 months but call sooner if needed with pfts> not done    11/01/2013 f/u ov/[name] re: COPD with  asthma overlap Chief Complaint  Patient presents with  . Follow-up    Pt states that overall his breathing is doing well.  No new co's today.  He used rescue inhaler a few times 1 day ago due to chest tightness that he related to the weather.    typically Not limited by breathing from desired activities  But not aerobically active  No obvious day to day or daytime variabilty or assoc chronic cough or cp or chest tightness, subjective wheeze overt sinus or hb symptoms. No unusual exp hx or h/o childhood pna/ asthma or knowledge of premature birth.  Sleeping ok without nocturnal  or early am exacerbation  of respiratory  c/o's or need for noct saba. Also denies any obvious fluctuation of symptoms with weather or environmental changes or other aggravating or alleviating factors except as outlined above   Current Medications, Allergies, Complete Past Medical History, Past Surgical History, Family History, and Social History were reviewed in [employer] record.  ROS  The following are not active complaints unless bolded sore throat, dysphagia, dental problems, itching, sneezing,  nasal congestion or excess/ purulent secretions, ear ache,   fever,  chills, sweats, unintended wt loss, pleuritic or exertional cp, hemoptysis,  orthopnea pnd or leg swelling, presyncope, palpitations, heartburn, abdominal pain, anorexia, nausea, vomiting, diarrhea  or change in bowel or urinary habits, change in stools or urine, dysuria,hematuria,  rash, arthralgias, visual complaints, headache, numbness weakness or ataxia or problems with walking or coordination,  change in mood/affect or memory.         No obvious day to day or daytime variabilty or assoc cp or chest tightness, subjective wheeze overt sinus or hb symptoms. No unusual exp hx or h/o childhood pna/ asthma or knowledge of premature birth.  Sleeping ok without nocturnal  or early am exacerbation  of respiratory  c/o's or need for noct  saba. Also denies any obvious fluctuation of symptoms with weather or environmental changes or other aggravating or alleviating factors except as outlined above   Current Medications, Allergies, Complete Past Medical History, Past Surgical History, Family History, and Social History were reviewed [facility].  ROS  The following are not active complaints unless bolded sore throat, dysphagia, dental problems, itching, sneezing,  nasal congestion or excess/ purulent secretions, ear ache,   fever, chills, sweats, unintended wt loss, pleuritic or exertional cp, hemoptysis,  orthopnea pnd or leg swelling, presyncope, palpitations, heartburn, abdominal pain, anorexia, nausea, vomiting, diarrhea  or change in bowel or urinary habits, change in stools or urine, dysuria,hematuria,  rash, arthralgias, visual complaints, headache, numbness weakness or ataxia or problems with walking or coordination,  change in mood/affect or memory.         Past Medical History:  GERD  COPD/AB................................................................................................Marland Kitchen.Dr [name]  -quit smoking in 1999 . Smoked 1 pack a day for 20-25 years  - FEV1 of 45% of the predicted, documented in December of 2007.  - FEV1 48% November 10, 2008 with ratio 49  - Alpha1AT nl 10/26/2007  - HFA 75% November 10, 2008 > 90% April 20, 2009 > 100% Oct 10, 2009  - Xolair stopped after approx 3 years November 10, 2008 > no perceived benefit by pt  -Pneumovax 12/20/2010           Objective:   Physical Exam  wt 180 Oct 10, 2009 > 182 November 13, 2009 > 188 June 14, 2010 > 12/20/2010  175 > 163 10/21/2012 > 03/31/2013  174  HEENT mild turbinate edema. Oropharynx no thrush or excess pnd or cobblestoning. No JVD or cervical adenopathy. Mild accessory muscle hypertrophy. Trachea midline, nl thryroid. Chest was hyperinflated by percussion with diminished breath sounds and moderate increased exp time with  trace mid   bilateral exp wheezes. Hoover sign positive at mid inspiration. Regular rate and rhythm without murmur gallop or rub or increase P2 or edema. Abd: no hsm, nl excursion. Ext warm without cyanosis or clubbing     cxr 12/20/2010  COPD/chronic bronchitic changes. Old granulomatous disease. No acute findings.    Assessment:

## 2014-04-27 ENCOUNTER — Other Ambulatory Visit: Payer: Self-pay | Admitting: Internal Medicine

## 2014-11-08 ENCOUNTER — Other Ambulatory Visit: Payer: Self-pay | Admitting: Internal Medicine

## 2015-01-06 ENCOUNTER — Other Ambulatory Visit: Payer: Self-pay | Admitting: Internal Medicine

## 2015-02-27 ENCOUNTER — Other Ambulatory Visit: Payer: Self-pay | Admitting: Internal Medicine

## 2015-03-14 ENCOUNTER — Ambulatory Visit (INDEPENDENT_AMBULATORY_CARE_PROVIDER_SITE_OTHER)
Admission: RE | Admit: 2015-03-14 | Discharge: 2015-03-14 | Disposition: A | Discharge status: Home / Self Care | Payer: BLUE CROSS/BLUE SHIELD | Source: Ambulatory Visit | Attending: Internal Medicine | Admitting: Internal Medicine

## 2015-03-14 ENCOUNTER — Encounter: Payer: Self-pay | Admitting: Internal Medicine

## 2015-03-14 ENCOUNTER — Ambulatory Visit (INDEPENDENT_AMBULATORY_CARE_PROVIDER_SITE_OTHER): Payer: BLUE CROSS/BLUE SHIELD | Admitting: Internal Medicine

## 2015-03-14 ENCOUNTER — Ambulatory Visit: Payer: Self-pay | Admitting: Internal Medicine

## 2015-03-14 DIAGNOSIS — J449 Chronic obstructive pulmonary disease, unspecified: Secondary | ICD-10-CM

## 2015-03-14 MED ORDER — TIOTROPIUM BROMIDE MONOHYDRATE 2.5 MCG/ACT IN AERS: INHALATION_SPRAY | RESPIRATORY_TRACT | Status: DC

## 2015-03-14 NOTE — Progress Notes (Signed)
Quick Note:  Spoke with pt and notified of results per Dr. Wert. Pt verbalized understanding and denied any questions.  ______ 

## 2015-03-14 NOTE — Progress Notes (Signed)
Subjective:    Patient ID: [name], male   DOB: [birth year]   MRN: [id number]   Brief patient profile:  59 yowm quit smoking in 1999 an FEV1 of 45% predicted, documented in December of 2007 c/w GOLD III with a lot of reversibility    History of Present Illness  April 20, 2009 no limitations in breathing, no nocturnal resp co's, the best he's done in years. rec work on inhaler technique no change in rx.    10/21/2012 f/u ov/[name]  Chief Complaint  Patient presents with  . Acute Visit    Pt c/o increased and chest tightness for the past couple of months. He also has a prod cough with minimal cleat sputum. Has been using rescue inhaler 4 times per day for the past few days.   no purulent sputum, no noct flares, started with some ear and sinus congestion, did not double the prilosec as prev recs.     rec Prednisone 10 mg take  4 each am x 2 days,   2 each am x 2 days,  1 each am x2days and stop  Always remember when respiratory symptoms flare to use the prilosec before bfast and supper. Zyrtec 10 one at bedtime as needed for itching sneezing or nasal congestion     03/31/2013 f/u ov/[name] re:  COPD  Chief Complaint  Patient presents with  . Acute Visit    Pt states developed head cold 2 wks ago and 1 wk ago started having increased SOB, chest congestion and prod cough with minimal clear sputum. Using proair twice per day.   Had previously reduced to rare saba and now at least twice daily but not typically while sleeping. Has mucinex dm rec from last avs but not using it for cough as per action plan. No purulent sputum  rec Prednisone 10 mg take  4 each am x 2 days,   2 each am x 2 days,  1 each am x 2 days and stop  For cough mucinex dm 1200 mg every 12 hours  When flare with respiratory problems prilosec 20 mg Take 30- 60 min before your first and last meals of the day  Please schedule a follow up visit in 3 months but call sooner if needed with pfts> not done    11/01/2013 f/u  ov/[name] re: COPD with asthma overlap Chief Complaint  Patient presents with  . Follow-up    Pt states that overall his breathing is doing well.  No new co's today.  He used rescue inhaler a few times 1 day ago due to chest tightness that he related to the weather.   typically Not limited by breathing from desired activities  But not aerobically active rec Try spiriva respimat 2 puffs each am vs handhaler(powder) and see if you like it better and call us with your preference in one week so we can get you a year supply No change on symbicort 160 Take 2 puffs first thing in am and then another 2 puffs about 12 hours later.  Only use your albuterol as a rescue medication       03/14/2015  f/u ov/[name] re: copd/asthma overlap/ on symbicort / spiriva  - likes the respimat better but never purchased it Chief Complaint  Patient presents with  . Follow-up    Pt states that his breathing is doing well. He c/o minimal congestion after having a "cold". He uses albuterol inhaler 1-2 x per wk.     Has  been able to run  one half mile on treadmill at 4.5 mph no grade  but mostly just walking   Flat and up to 5 miles a day   No obvious day to day or daytime variabilty or assoc chronic cough or cp or chest tightness, subjective wheeze overt sinus or hb symptoms. No unusual exp hx or h/o childhood pna/ asthma or knowledge of premature birth.  Sleeping ok without nocturnal  or early am exacerbation  of respiratory  c/o's or need for noct saba. Also denies any obvious fluctuation of symptoms with weather or environmental changes or other aggravating or alleviating factors except as outlined above   Current Medications, Allergies, Complete Past Medical History, Past Surgical History, Family History, and Social History were reviewed in [employer] record.  ROS  The following are not active complaints unless bolded sore throat, dysphagia, dental problems, itching, sneezing,  nasal  congestion or excess/ purulent secretions, ear ache,   fever, chills, sweats, unintended wt loss, pleuritic or exertional cp, hemoptysis,  orthopnea pnd or leg swelling, presyncope, palpitations, heartburn, abdominal pain, anorexia, nausea, vomiting, diarrhea  or change in bowel or urinary habits, change in stools or urine, dysuria,hematuria,  rash, arthralgias, visual complaints, headache, numbness weakness or ataxia or problems with walking or coordination,  change in mood/affect or memory.                  Past Medical History:  GERD  COPD/AB................................................................................................Marland KitchenDr [name]  -quit smoking in 1999 . Smoked 1 pack a day for 20-25 years  - FEV1 of 45% of the predicted, documented in December of 2007.  - FEV1 48% November 10, 2008 with ratio 49  - Alpha1AT nl 10/26/2007  - HFA 75% November 10, 2008 > 90% April 20, 2009 > 100% Oct 10, 2009  - Xolair stopped after approx 3 years June  2010 > no perceived benefit by pt  -Pneumovax 12/20/2010           Objective:   Physical Exam  wt 180 Oct 10, 2009 > 182 November 13, 2009 > 188 June 14, 2010 > 12/20/2010  175 > 163 10/21/2012 > 03/31/2013  174 > 03/14/2015    169   amb slt tense wm nad/ vital signs reviewed  HEENT mild turbinate edema. Oropharynx no thrush or excess pnd or cobblestoning. No JVD or cervical adenopathy. Mild accessory muscle hypertrophy. Trachea midline, nl thryroid. Chest was hyperinflated by percussion with diminished breath sounds and moderate increased exp time with trace late  bilateral exp wheezes. Hoover sign positive at mid inspiration. Regular rate and rhythm without murmur gallop or rub or increase P2 or edema. Abd: no hsm, nl excursion. Ext warm without cyanosis or clubbing   CXR PA and Lateral:   03/14/2015 :    I personally reviewed images and agree with radiology impression as follows:   No active cardiopulmonary disease.     Assessment:      Outpatient Encounter Prescriptions as of 03/14/2015  Medication Sig  . albuterol (PROAIR HFA) 108 (90 BASE) MCG/ACT inhaler 2 puffs every 6 hours as needed .2 puffs every 4 hours as needed only  if your can't catch your breath  . cetirizine (ZYRTEC) 10 MG tablet Take 10 mg by mouth at bedtime.    Marland Kitchen dextromethorphan-guaiFENesin (MUCINEX DM) 30-600 MG per 12 hr tablet Take 1 tablet by mouth every 12 (twelve) hours as needed.   . Multiple Vitamin (MULTIVITAMIN) capsule Take 1 capsule by mouth  daily.    . omeprazole (PRILOSEC) 20 MG capsule TAKE 30 TO 60 MINUTES BEFORE FIRST MEAL OF THE DAY AND A SECOND DOSE BEFORE SUPPER IN THE EVENT OF ANY FLARE OF RESPIRATORY SYMPTOMS  . SYMBICORT 160-4.5 MCG/ACT inhaler USE 2 INHALATIONS INTO THE LUNGS TWICE A DAY  . [DISCONTINUED] SPIRIVA HANDIHALER 18 MCG inhalation capsule INHALE THE CONTENTS OF 1 CAPSULE DAILY  . [DISCONTINUED] Tiotropium Bromide Monohydrate (SPIRIVA RESPIMAT) 2.5 MCG/ACT AERS 2 pffs each am  . [DISCONTINUED] Tiotropium Bromide Monohydrate (SPIRIVA RESPIMAT) 2.5 MCG/ACT AERS 2 pffs each am  . [DISCONTINUED] pneumococcal 23 valent vaccine (PNU-IMMUNE) injection 0.5 mL    No facility-administered encounter medications on file as of 03/14/2015.  Plus spiriva respimat 2 puffs each am

## 2015-03-14 NOTE — Patient Instructions (Signed)
Please remember to go to the   x-ray department downstairs for your tests - we will call you with the results when they are available.  Change spiriva to respimat   If you are satisfied with your treatment plan,  let your doctor know and he/she can either refill your medications or you can return here when your prescription runs out.     If in any way you are not 100% satisfied,  please tell us.  If 100% better, tell your friends!  Pulmonary follow up is as needed

## 2015-03-16 ENCOUNTER — Telehealth: Payer: Self-pay | Admitting: Internal Medicine

## 2015-03-16 MED ORDER — TIOTROPIUM BROMIDE MONOHYDRATE 2.5 MCG/ACT IN AERS: INHALATION_SPRAY | RESPIRATORY_TRACT | Status: DC

## 2015-03-16 NOTE — Telephone Encounter (Signed)
Spoke with the pt's spouse and notified that spirva sent to [facility]  Nothing further needed

## 2015-03-19 NOTE — Assessment & Plan Note (Addendum)
-  quit smoking in 1999 . Smoked 1 pack a day for 20-25 years  - FEV1 of 45% of the predicted, documented in December of 2007.  - FEV1 48% November 10, 2008 with ratio 49  - Alpha1AT  10/25/2008>>  nl   - Xolair stopped after approx 3 years November 10, 2008> no benefit     I had an extended final summary discussion with the patient reviewing all relevant studies completed to date and  lasting 15 to 20 minutes of a 25 minute visit on the following issues:    1) The proper method of use, as well as anticipated side effects, of a metered-dose inhaler are discussed and demonstrated to the patient. Improved effectiveness after extensive coaching during this visit to a level of approximately  90% so should do fine on hfa symbicort and respimat rx  2)  I reviewed the Fletcher curve with the patient that basically indicates  if you quit smoking when your best day FEV1 is still well preserved (as is relatively  the case here)  it is highly unlikely you will progress to severe disease and informed the patient there was no medication on the market that has proven to alter the curve/ its downward trajectory  or the likelihood of progression of their disease.  Therefore stopping smoking and maintaining abstinence is the most important aspect of care, not choice of inhalers or for that matter, doctors.    3) Each maintenance medication was reviewed in detail including most importantly the difference between maintenance and as needed and under what circumstances the prns are to be used.  Please see instructions for details which were reviewed in writing and the patient given a copy.

## 2015-04-26 ENCOUNTER — Other Ambulatory Visit: Payer: Self-pay | Admitting: Internal Medicine

## 2015-04-26 ENCOUNTER — Telehealth: Payer: Self-pay | Admitting: Internal Medicine

## 2015-04-26 MED ORDER — ALBUTEROL SULFATE HFA 108 (90 BASE) MCG/ACT IN AERS: INHALATION_SPRAY | RESPIRATORY_TRACT | Status: DC

## 2015-04-26 NOTE — Telephone Encounter (Signed)
Spoke with pt's wife. States that pt needs refills on Proair. Rx has been sent in. Nothing further was needed.

## 2015-12-06 ENCOUNTER — Encounter (HOSPITAL_COMMUNITY): Payer: Self-pay | Admitting: Emergency Medicine

## 2015-12-06 ENCOUNTER — Emergency Department (HOSPITAL_COMMUNITY)
Admission: EM | Admit: 2015-12-06 | Discharge: 2015-12-06 | Disposition: A | Discharge status: Home / Self Care | Payer: BLUE CROSS/BLUE SHIELD | Attending: Emergency Medicine | Admitting: Emergency Medicine

## 2015-12-06 ENCOUNTER — Emergency Department (HOSPITAL_COMMUNITY): Payer: BLUE CROSS/BLUE SHIELD

## 2015-12-06 DIAGNOSIS — J449 Chronic obstructive pulmonary disease, unspecified: Secondary | ICD-10-CM | POA: Diagnosis not present

## 2015-12-06 DIAGNOSIS — Z79899 Other long term (current) drug therapy: Secondary | ICD-10-CM | POA: Diagnosis not present

## 2015-12-06 DIAGNOSIS — E119 Type 2 diabetes mellitus without complications: Secondary | ICD-10-CM | POA: Diagnosis not present

## 2015-12-06 DIAGNOSIS — S61311A Laceration without foreign body of left index finger with damage to nail, initial encounter: Secondary | ICD-10-CM | POA: Insufficient documentation

## 2015-12-06 DIAGNOSIS — Z87891 Personal history of nicotine dependence: Secondary | ICD-10-CM | POA: Diagnosis not present

## 2015-12-06 DIAGNOSIS — Y939 Activity, unspecified: Secondary | ICD-10-CM | POA: Insufficient documentation

## 2015-12-06 DIAGNOSIS — Y999 Unspecified external cause status: Secondary | ICD-10-CM | POA: Insufficient documentation

## 2015-12-06 DIAGNOSIS — Y929 Unspecified place or not applicable: Secondary | ICD-10-CM | POA: Diagnosis not present

## 2015-12-06 DIAGNOSIS — S61219A Laceration without foreign body of unspecified finger without damage to nail, initial encounter: Secondary | ICD-10-CM

## 2015-12-06 DIAGNOSIS — Z7951 Long term (current) use of inhaled steroids: Secondary | ICD-10-CM | POA: Diagnosis not present

## 2015-12-06 DIAGNOSIS — W268XXA Contact with other sharp object(s), not elsewhere classified, initial encounter: Secondary | ICD-10-CM | POA: Insufficient documentation

## 2015-12-06 HISTORY — DX: Other cervical disc degeneration, unspecified cervical region: M50.30

## 2015-12-06 HISTORY — DX: Unspecified tear of unspecified meniscus, current injury, unspecified knee, initial encounter: S83.209A

## 2015-12-06 MED ORDER — BUPIVACAINE HCL (PF) 0.5 % IJ SOLN
50.0000 mL | Freq: Once | INTRAMUSCULAR | Status: AC
  Administered 2015-12-06: 50 mL
  Filled 2015-12-06: qty 60
  Filled 2015-12-06: qty 50

## 2015-12-06 MED ORDER — LORAZEPAM 2 MG/ML IJ SOLN
1.0000 mg | Freq: Once | INTRAMUSCULAR | Status: AC
  Administered 2015-12-06: 1 mg via INTRAVENOUS
  Filled 2015-12-06: qty 1

## 2015-12-06 MED ORDER — DEXTROSE 5 % IV SOLN
1.0000 g | Freq: Once | INTRAVENOUS | Status: AC
  Administered 2015-12-06: 1 g via INTRAVENOUS
  Filled 2015-12-06: qty 10

## 2015-12-06 MED ORDER — OXYCODONE-ACETAMINOPHEN 5-325 MG PO TABS
1.0000 | ORAL_TABLET | ORAL | Status: DC | PRN
  Administered 2015-12-06: 1 via ORAL
  Filled 2015-12-06: qty 1

## 2015-12-06 MED ORDER — OXYCODONE-ACETAMINOPHEN 5-325 MG PO TABS: 1.0000 | ORAL_TABLET | Freq: Four times a day (QID) | ORAL | Status: DC | PRN

## 2015-12-06 NOTE — ED Provider Notes (Signed)
CSN: [id number]     Arrival date & time 12/06/15  1754 History   First MD Initiated Contact with Patient 12/06/15 1920     Chief Complaint  Patient presents with  . Finger Injury      HPI  Expand All Collapse All   Pt stated about an hour and a half ago he accidentally got his left pointer finger too close to his chipper and it got caught in the blades. Went to urgent care and was told to come here because there may be some tendon and bone damage        Past Medical History  Diagnosis Date  . GERD (gastroesophageal reflux disease)   . COPD (chronic obstructive pulmonary disease) (HCC)   . Diabetes mellitus     T2DM  . Degenerative disc disease, cervical   . Torn meniscus    Past Surgical History  Procedure Laterality Date  . Neck surgery      degenerative disc   . Knee arthroscopy     Family History  Problem Relation Age of Onset  . COPD Mother   . Diabetes Mother   . Atopy Neg Hx   . Diabetes Maternal Grandmother    Social History  Substance Use Topics  . Smoking status: Former Smoker -- 1.00 packs/day for 15 years    Types: Cigarettes    Quit date: 06/03/1997  . Smokeless tobacco: Current User    Types: Chew  . Alcohol Use: 7.2 oz/week    12 Standard drinks or equivalent per week    Review of Systems  All other systems reviewed and are negative.     Allergies  Food and Penicillins  Home Medications   Prior to Admission medications   Medication Sig Start Date End Date Taking? Authorizing Provider  albuterol (PROAIR HFA) 108 (90 BASE) MCG/ACT inhaler 2 puffs every 6 hours as needed .2 puffs every 4 hours as needed only  if your can't catch your breath 04/26/15  Yes Nyoka CowdenMichael B Wert, MD  cetirizine (ZYRTEC) 10 MG tablet Take 10 mg by mouth at bedtime.     Yes Historical Provider, MD  hydrOXYzine (ATARAX/VISTARIL) 10 MG tablet TAKE 1-2 TABLETS BY MOUTH DAILY AT BEDTIME AS NEEDED FOR ITCHING 10/11/15  Yes Historical Provider, MD  Multiple Vitamin  (MULTIVITAMIN) capsule Take 1 capsule by mouth daily.     Yes Historical Provider, MD  omeprazole (PRILOSEC) 20 MG capsule TAKE AS INSTRUCTED BY YOUR PRESCRIBER Patient taking differently: Take 1 capsule by mouth daily 04/26/15  Yes Nyoka CowdenMichael B Wert, MD  SYMBICORT 160-4.5 MCG/ACT inhaler USE 2 INHALATIONS TWICE A DAY (NEED APPOINTMENT FOR REFILLS) 04/26/15  Yes Nyoka CowdenMichael B Wert, MD  Tiotropium Bromide Monohydrate (SPIRIVA RESPIMAT) 2.5 MCG/ACT AERS 2 pffs each am 03/16/15  Yes Nyoka CowdenMichael B Wert, MD  dextromethorphan-guaiFENesin [facility](MUCINEX DM) 30-600 MG per 12 hr tablet Take 1 tablet by mouth every 12 (twelve) hours as needed for cough.     Historical Provider, MD  oxyCODONE-acetaminophen (PERCOCET/ROXICET) 5-325 MG tablet Take 1-2 tablets by mouth every 6 (six) hours as needed for moderate pain. 12/06/15   Nelva Nayobert Javaun Dimperio, MD   BP 108/73 mmHg  Pulse 68  Temp(Src) 98.4 F (36.9 C) (Oral)  Resp 18  Ht 5\' 9"  (1.753 m)  Wt 160 lb (72.576 kg)  BMI 23.62 kg/m2  SpO2 96% Physical Exam          ED Course  Procedures (including critical care time) Medications  oxyCODONE-acetaminophen (PERCOCET/ROXICET) 5-325 MG per  tablet 1 tablet (1 tablet Oral Given 12/06/15 1819)  bupivacaine (MARCAINE) 0.5 % injection 50 mL (50 mLs Infiltration Given 12/06/15 2049)  LORazepam (ATIVAN) injection 1 mg (1 mg Intravenous Given 12/06/15 2014)  cefTRIAXone (ROCEPHIN) 1 g in dextrose 5 % 50 mL IVPB (0 g Intravenous Stopped 12/06/15 2044)    Labs Review Labs Reviewed - No data to display  Imaging Review Dg Finger Index Left  12/06/2015  CLINICAL DATA:  Index finger pain after injury. Laceration from "chipper". EXAM: LEFT INDEX FINGER 2+V COMPARISON:  None. FINDINGS: Tiny minimally displaced distal tuft fracture. Overlying dressing partial limits evaluation the soft tissues, likely soft tissue injury to the distal digit. No radiopaque foreign body. Proximal finger is intact. IMPRESSION: Tiny minimally displaced distal tuft  fracture with overlying soft tissue injury. Electronically Signed   By: Rubye OaksMelanie  Ehinger M.D.   On: 12/06/2015 18:39   I have personally reviewed and evaluated these images and lab results as part of my medical decision-making.  I discussed with hand surgery, Dr. Orlan Leavensrtman, who recommended we do a digital block and clean the finger.  Patient was given Rocephin IV and will be seen tomorrow morning at 7 AM at his office.  Patient to stay nothing by mouth after midnight tonight.  MDM   Final diagnoses:  Finger laceration, initial encounter        Nelva Nayobert Chanceler Pullin, MD 12/06/15 2256

## 2015-12-06 NOTE — ED Provider Notes (Signed)
NERVE BLOCK Performed by: WUJWJ,XBJYNSOFIA,KAREN Consent: Verbal consent obtained. Required items: required blood products, implants, devices, and special equipment available Time out: Immediately prior to procedure a "time out" was called to verify the correct patient, procedure, equipment, support staff and site/side marked as required.  Indication: laceration left index finger Nerve block body site: finger  Preparation: Patient was prepped and draped in the usual sterile fashion. Needle gauge: 24 G Location technique: anatomical landmarks  Local anesthetic: bupivicaine   Anesthetic total: 8 ml  Outcome: pain improved Patient tolerance: Patient tolerated the procedure well with no immediate complications.  Wound irrigatted, scrubbed, xeroform dressing  Elson AreasLeslie K Major [place], PA-C 12/06/15 2031  Nelva Nayobert Beaton, MD 12/06/15 2257

## 2015-12-06 NOTE — ED Notes (Signed)
MD at bedside. 

## 2015-12-06 NOTE — ED Notes (Signed)
Wound scrubbed by provider and dressed.

## 2015-12-06 NOTE — ED Notes (Signed)
Patient transported to X-ray 

## 2015-12-06 NOTE — ED Notes (Signed)
Pt stated about an hour and a half ago he accidentally got his left pointer finger too close to his chipper and it got caught in the blades.  Went to urgent care and was told to come here because there may be some tendon and bone damage.

## 2015-12-06 NOTE — ED Notes (Signed)
Pt denies any pain medication administration from urgent care but did receive zofran from there.

## 2015-12-06 NOTE — Discharge Instructions (Signed)
Delayed Wound Closure Sometimes, your health care provider will decide to delay closing a wound for several days. This is done when the wound is badly bruised, dirty, or when it has been several hours since the injury happened. By delaying the closure of your wound, the risk of infection is reduced. Wounds that are closed in 3-7 days after being cleaned up and dressed heal just as well as those that are closed right away. HOME CARE INSTRUCTIONS  Rest and elevate the injured area until the pain and swelling are gone.  Have your wound checked as instructed by your health care provider. SEEK MEDICAL CARE IF:  You develop unusual or increased swelling or redness around the wound.  You have increasing pain or tenderness.  There is increasing fluid (drainage) or a bad smelling drainage coming from the wound.   This information is not intended to replace advice given to you by your health care provider. Make sure you discuss any questions you have with your health care provider.   Document Released: 05/20/2005 Document Revised: 05/25/2013 Document Reviewed: 11/17/2012 Elsevier Interactive Patient Education 2016 [employer]  Laceration Care, Adult A laceration is a cut that goes through all of the layers of the skin and into the tissue that is right under the skin. Some lacerations heal on their own. Others need to be closed with stitches (sutures), staples, skin adhesive strips, or skin glue. Proper laceration care minimizes the risk of infection and helps the laceration to heal better. HOW TO CARE FOR YOUR LACERATION If sutures or staples were used:  Keep the wound clean and dry.  If you were given a bandage (dressing), you should change it at least one time per day or as told by your health care provider. You should also change it if it becomes wet or dirty.  Keep the wound completely dry for the first 24 hours or as told by your health care provider. After that time, you may shower or  bathe. However, make sure that the wound is not soaked in water until after the sutures or staples have been removed.  Clean the wound one time each day or as told by your health care provider:  Wash the wound with soap and water.  Rinse the wound with water to remove all soap.  Pat the wound dry with a clean towel. Do not rub the wound.  After cleaning the wound, apply a thin layer of antibiotic ointmentas told by your health care provider. This will help to prevent infection and keep the dressing from sticking to the wound.  Have the sutures or staples removed as told by your health care provider. If skin adhesive strips were used:  Keep the wound clean and dry.  If you were given a bandage (dressing), you should change it at least one time per day or as told by your health care provider. You should also change it if it becomes dirty or wet.  Do not get the skin adhesive strips wet. You may shower or bathe, but be careful to keep the wound dry.  If the wound gets wet, pat it dry with a clean towel. Do not rub the wound.  Skin adhesive strips fall off on their own. You may trim the strips as the wound heals. Do not remove skin adhesive strips that are still stuck to the wound. They will fall off in time. If skin glue was used:  Try to keep the wound dry, but you may briefly wet  it in the shower or bath. Do not soak the wound in water, such as by swimming.  After you have showered or bathed, gently pat the wound dry with a clean towel. Do not rub the wound.  Do not do any activities that will make you sweat heavily until the skin glue has fallen off on its own.  Do not apply liquid, cream, or ointment medicine to the wound while the skin glue is in place. Using those may loosen the film before the wound has healed.  If you were given a bandage (dressing), you should change it at least one time per day or as told by your health care provider. You should also change it if it becomes  dirty or wet.  If a dressing is placed over the wound, be careful not to apply tape directly over the skin glue. Doing that may cause the glue to be pulled off before the wound has healed.  Do not pick at the glue. The skin glue usually remains in place for 5-10 days, then it falls off of the skin. General Instructions  Take over-the-counter and prescription medicines only as told by your health care provider.  If you were prescribed an antibiotic medicine or ointment, take or apply it as told by your doctor. Do not stop using it even if your condition improves.  To help prevent scarring, make sure to cover your wound with sunscreen whenever you are outside after stitches are removed, after adhesive strips are removed, or when glue remains in place and the wound is healed. Make sure to wear a sunscreen of at least 30 SPF.  Do not scratch or pick at the wound.  Keep all follow-up visits as told by your health care provider. This is important.  Check your wound every day for signs of infection. Watch for:  Redness, swelling, or pain.  Fluid, blood, or pus.  Raise (elevate) the injured area above the level of your heart while you are sitting or lying down, if possible. SEEK MEDICAL CARE IF:  You received a tetanus shot and you have swelling, severe pain, redness, or bleeding at the injection site.  You have a fever.  A wound that was closed breaks open.  You notice a bad smell coming from your wound or your dressing.  You notice something coming out of the wound, such as wood or glass.  Your pain is not controlled with medicine.  You have increased redness, swelling, or pain at the site of your wound.  You have fluid, blood, or pus coming from your wound.  You notice a change in the color of your skin near your wound.  You need to change the dressing frequently due to fluid, blood, or pus draining from the wound.  You develop a new rash.  You develop numbness around the  wound. SEEK IMMEDIATE MEDICAL CARE IF:  You develop severe swelling around the wound.  Your pain suddenly increases and is severe.  You develop painful lumps near the wound or on skin that is anywhere on your body.  You have a red streak going away from your wound.  The wound is on your hand or foot and you cannot properly move a finger or toe.  The wound is on your hand or foot and you notice that your fingers or toes look pale or bluish.   This information is not intended to replace advice given to you by your health care provider. Make sure you discuss any  questions you have with your health care provider.   Document Released: 05/20/2005 Document Revised: 10/04/2014 Document Reviewed: 05/16/2014 Elsevier Interactive Patient Education [employer].

## 2016-06-01 ENCOUNTER — Other Ambulatory Visit: Payer: Self-pay | Admitting: Internal Medicine

## 2016-06-04 ENCOUNTER — Other Ambulatory Visit: Payer: Self-pay

## 2016-06-04 MED ORDER — OMEPRAZOLE 20 MG PO CPDR: 20.0000 mg | DELAYED_RELEASE_CAPSULE | Freq: Every day | ORAL | 3 refills | Status: DC

## 2020-05-15 ENCOUNTER — Telehealth: Payer: Self-pay | Admitting: Internal Medicine

## 2020-05-15 NOTE — Telephone Encounter (Signed)
Called and spoke with pt who states he has had complaints of SOB x2 weeks which began after getting leaves up. Pt also has had complaints of wheezing, chest tightness, and an occ cough with clear phlegm due to congestion.  Pt states that  He is still using his symbicort and spiriva inhalers and also has been taking mucinex.  Pt has used rescue inhaler about 4 times daily. Pt states that he has received his covid vaccines.  Stated to pt since we have not seen him since 03/2015 we would need to schedule him a consult appt with provider that has a slot avail. First avail slot with any of our providers for slot wasn't going to be  Until 12/15 and after stating that to pt since he could not get anything today, pt stated he would call PCP. Nothing further needed.

## 2020-06-30 ENCOUNTER — Ambulatory Visit: Payer: BLUE CROSS/BLUE SHIELD | Admitting: Internal Medicine

## 2020-06-30 ENCOUNTER — Ambulatory Visit (HOSPITAL_COMMUNITY)
Admission: RE | Admit: 2020-06-30 | Discharge: 2020-06-30 | Disposition: A | Discharge status: Home / Self Care | Payer: BC Managed Care – PPO | Source: Ambulatory Visit | Attending: Internal Medicine | Admitting: Internal Medicine

## 2020-06-30 ENCOUNTER — Other Ambulatory Visit: Payer: Self-pay

## 2020-06-30 ENCOUNTER — Other Ambulatory Visit (HOSPITAL_COMMUNITY)
Admission: RE | Admit: 2020-06-30 | Discharge: 2020-06-30 | Disposition: A | Discharge status: Home / Self Care | Payer: BC Managed Care – PPO | Source: Ambulatory Visit | Attending: Internal Medicine | Admitting: Internal Medicine

## 2020-06-30 ENCOUNTER — Encounter: Payer: Self-pay | Admitting: Internal Medicine

## 2020-06-30 DIAGNOSIS — J449 Chronic obstructive pulmonary disease, unspecified: Secondary | ICD-10-CM

## 2020-06-30 LAB — CBC WITH DIFFERENTIAL/PLATELET
Abs Immature Granulocytes: 0.02 10*3/uL (ref 0.00–0.07)
Basophils Absolute: 0 10*3/uL (ref 0.0–0.1)
Basophils Relative: 1 %
Eosinophils Absolute: 0.3 10*3/uL (ref 0.0–0.5)
Eosinophils Relative: 6 %
HCT: 44.5 % (ref 39.0–52.0)
Hemoglobin: 14.7 g/dL (ref 13.0–17.0)
Immature Granulocytes: 0 %
Lymphocytes Relative: 24 %
Lymphs Abs: 1.3 10*3/uL (ref 0.7–4.0)
MCH: 31.1 pg (ref 26.0–34.0)
MCHC: 33 g/dL (ref 30.0–36.0)
MCV: 94.3 fL (ref 80.0–100.0)
Monocytes Absolute: 0.6 10*3/uL (ref 0.1–1.0)
Monocytes Relative: 11 %
Neutro Abs: 2.9 10*3/uL (ref 1.7–7.7)
Neutrophils Relative %: 58 %
Platelets: 265 10*3/uL (ref 150–400)
RBC: 4.72 MIL/uL (ref 4.22–5.81)
RDW: 12.9 % (ref 11.5–15.5)
WBC: 5.2 10*3/uL (ref 4.0–10.5)
nRBC: 0 % (ref 0.0–0.2)

## 2020-06-30 MED ORDER — BREZTRI AEROSPHERE 160-9-4.8 MCG/ACT IN AERO: 2.0000 | INHALATION_SPRAY | Freq: Two times a day (BID) | RESPIRATORY_TRACT | 11 refills | Status: DC

## 2020-06-30 MED ORDER — FAMOTIDINE 20 MG PO TABS: ORAL_TABLET | ORAL | 11 refills | Status: DC

## 2020-06-30 MED ORDER — BREZTRI AEROSPHERE 160-9-4.8 MCG/ACT IN AERO: 2.0000 | INHALATION_SPRAY | Freq: Two times a day (BID) | RESPIRATORY_TRACT | 0 refills | Status: DC

## 2020-06-30 MED ORDER — PANTOPRAZOLE SODIUM 40 MG PO TBEC: 40.0000 mg | DELAYED_RELEASE_TABLET | Freq: Every day | ORAL | 2 refills | Status: DC

## 2020-06-30 NOTE — Patient Instructions (Addendum)
Plan A = Automatic = Always=   Breztri Take 2 puffs first thing in am and then another 2 puffs about 12 hours later.    Work on inhaler technique:  relax and gently blow all the way out then take a nice smooth deep breath back in, triggering the inhaler at same time you start breathing in.  Hold for up to 5 seconds if you can. Blow out thru nose. Rinse and gargle with water when done    Plan B = Backup (to supplement plan A, not to replace it) Only use your albuterol inhaler as a rescue medication to be used if you can't catch your breath by resting or doing a relaxed purse lip breathing pattern.  - The less you use it, the better it will work when you need it. - Ok to use the inhaler up to 2 puffs  every 4 hours if you must but call for appointment if use goes up over your usual need - Don't leave home without it !!  (think of it like the spare tire for your car)     Also ok Try albuterol 15 min before an activity that you know would make you short of breath and see if it makes any difference and if makes none then don't take it after activity unless you can't catch your breath.      Please schedule a follow up office visit in 6 weeks, call sooner if needed

## 2020-06-30 NOTE — Assessment & Plan Note (Addendum)
Quit smoking in 1999 . Smoked 1 pack a day for 20-25 years  - FEV1 of 45% of the predicted, documented in December of 2007.  - FEV1 48% November 10, 2008 with ratio 49  - Alpha1AT  10/25/2008>>  nl   - Xolair stopped after approx 3 years November 10, 2008> no benefit - changed spiriva to respimat .03/14/2015    - 06/30/2020  After extensive coaching inhaler device,  effectiveness =    90% changed to breztri 2 bid  - Allergy profile 06/30/2020 >  Eos 0.3/  IgE  Pending   Pt is Group B in terms of symptom/risk and laba/lama therefore appropriate rx at this point >>>  breztry 2bid  Plus prn saba  Re saba: I spent extra time with pt today reviewing appropriate use of albuterol for prn use on exertion with the following points: 1) saba is for relief of sob that does not improve by walking a slower pace or resting but rather if the pt does not improve after trying this first. 2) If the pt is convinced, as many are, that saba helps recover from activity faster then it's easy to tell if this is the case by re-challenging : ie stop, take the inhaler, then p 5 minutes try the exact same activity (intensity of workload) that just caused the symptoms and see if they are substantially diminished or not after saba 3) if there is an activity that reproducibly causes the symptoms, try the saba 15 min before the activity on alternate days   If in fact the saba really does help, then fine to continue to use it prn but advised may need to look closer at the maintenance regimen being used to achieve better control of airways disease with exertion.            Each maintenance medication was reviewed in detail including emphasizing most importantly the difference between maintenance and prns and under what circumstances the prns are to be triggered using an action plan format where appropriate.  Total time for H and P, chart review, counseling, reviewing hfa device(s) and generating customized AVS unique to this office  visit / same day charting = > 45 min

## 2020-06-30 NOTE — Progress Notes (Signed)
Subjective:    Patient ID: [name], male   DOB: 12-17-1955   MRN: [id number]   Brief patient profile:  64 yowm quit smoking in 1999 an FEV1 of 45% predicted, documented in December of 2007 c/w GOLD III with a lot of reversibility    History of Present Illness  April 20, 2009 no limitations in breathing, no nocturnal resp co's, the best he's done in years.  rec work on inhaler technique no change in rx.     03/31/2013 f/u ov/[name] re:  COPD  Chief Complaint  Patient presents with  . Acute Visit    Pt states developed head cold 2 wks ago and 1 wk ago started having increased SOB, chest congestion and prod cough with minimal clear sputum. Using proair twice per day.   Had previously reduced to rare saba and now at least twice daily but not typically while sleeping. Has mucinex dm rec from last avs but not using it for cough as per action plan. No purulent sputum  rec Prednisone 10 mg take  4 each am x 2 days,   2 each am x 2 days,  1 each am x 2 days and stop  For cough mucinex dm 1200 mg every 12 hours  When flare with respiratory problems prilosec 20 mg Take 30- 60 min before your first and last meals of the day  Please schedule a follow up visit in 3 months but call sooner if needed with pfts> not done    11/01/2013 f/u ov/[name] re: COPD with asthma overlap Chief Complaint  Patient presents with  . Follow-up    Pt states that overall his breathing is doing well.  No new co's today.  He used rescue inhaler a few times 1 day ago due to chest tightness that he related to the weather.   typically Not limited by breathing from desired activities  But not aerobically active rec Try spiriva respimat 2 puffs each am vs handhaler(powder) and see if you like it better and call us with your preference in one week so we can get you a year supply No change on symbicort 160 Take 2 puffs first thing in am and then another 2 puffs about 12 hours later.  Only use your albuterol as a rescue  medication       03/14/2015  f/u ov/[name] re: copd/asthma overlap/ on symbicort / spiriva  - likes the respimat better but never purchased it Chief Complaint  Patient presents with  . Follow-up    Pt states that his breathing is doing well. He c/o minimal congestion after having a "cold". He uses albuterol inhaler 1-2 x per wk.   Has been able to run  one half mile on treadmill at 4.5 mph no grade  but mostly just walking   Flat and up to 5 miles a day  rec Change to spiriva respimat plus continue symbicort 160     06/30/2020   ov/[place] office/[name] re: copd to re-establish sp aecopd Nov 2021 req sev courses prednisone Chief Complaint  Patient presents with  . Consult    Shortness of breath with activity  Dyspnea:  No neighborhood walking , can't chop wood  like used to s resting  Cough: first thing in am thick white mucus Sleeping: on side/ bed is flat  SABA use: once a day  02: none    No obvious day to day or daytime variability or assoc  purulent sputum or mucus plugs or hemoptysis  or cp or chest tightness, subjective wheeze or overt sinus or hb symptoms.   Sleeping  without nocturnal  or early am exacerbation  of respiratory  c/o's or need for noct saba. Also denies any obvious fluctuation of symptoms with weather or environmental changes or other aggravating or alleviating factors except as outlined above   No unusual exposure hx or h/o childhood pna/ asthma or knowledge of premature birth.  Current Allergies, Complete Past Medical History, Past Surgical History, Family History, and Social History were reviewed in [employer] record.  ROS  The following are not active complaints unless bolded Hoarseness, sore throat, dysphagia, dental problems, itching, sneezing,  nasal congestion or discharge of excess mucus or purulent secretions, ear ache,   fever, chills, sweats, unintended wt loss or wt gain, classically pleuritic or exertional cp,  orthopnea  pnd or arm/hand swelling  or leg swelling, presyncope, palpitations, abdominal pain, anorexia, nausea, vomiting, diarrhea  or change in bowel habits or change in bladder habits, change in stools or change in urine, dysuria, hematuria,  rash, arthralgias, visual complaints, headache, numbness, weakness or ataxia or problems with walking or coordination,  change in mood or  memory.        Current Meds  Medication Sig  . albuterol (PROAIR HFA) 108 (90 BASE) MCG/ACT inhaler 2 puffs every 6 hours as needed .2 puffs every 4 hours as needed only  if your can't catch your breath  . cetirizine (ZYRTEC) 10 MG tablet Take 10 mg by mouth at bedtime.  Marland Kitchen dextromethorphan-guaiFENesin (MUCINEX DM) 30-600 MG per 12 hr tablet Take 1 tablet by mouth every 12 (twelve) hours as needed for cough.  . hydrOXYzine (ATARAX/VISTARIL) 10 MG tablet TAKE 1-2 TABLETS BY MOUTH DAILY AT BEDTIME AS NEEDED FOR ITCHING  . Multiple Vitamin (MULTIVITAMIN) capsule Take 1 capsule by mouth daily.  Marland Kitchen omeprazole (PRILOSEC) 20 MG capsule Take 1 capsule (20 mg total) by mouth daily.  . SYMBICORT 160-4.5 MCG/ACT inhaler USE 2 INHALATIONS TWICE A DAY (NEED APPOINTMENT FOR REFILLS)  . Tiotropium Bromide Monohydrate (SPIRIVA RESPIMAT) 2.5 MCG/ACT AERS 2 pffs each am             Past Medical History:  GERD  COPD/AB................................................................................................Marland KitchenDr [name]  -quit smoking in 1999 . Smoked 1 pack a day for 20-25 years  - FEV1 of 45% of the predicted, documented in December of 2007.  - FEV1 48% November 10, 2008 with ratio 49  - Alpha1 AT nl 10/26/2007  - HFA 75% November 10, 2008 > 90% April 20, 2009 > 100% Oct 10, 2009  - Xolair stopped after approx 3 years June  2010 > no perceived benefit by pt  -Pneumovax 12/20/2010           Objective:   Physical Exam  06/30/2020      168 03/14/2015    169 03/31/2013    174  wt 180 Oct 10, 2009 > 182 November 13, 2009 > 188 June 14, 2010  > 12/20/2010  175 > 163 10/21/2012 >  amb thin wm nad   Vital signs reviewed  06/30/2020  - Note at rest 02 sats  97% on RA     HEENT : pt wearing mask not removed for exam due to covid - 19 concerns.    NECK :  without JVD/Nodes/TM/ nl carotid upstrokes bilaterally   LUNGS: no acc muscle use,  Mild barrel  contour chest wall with bilateral  Distant exp rhonchi  s audible wheeze  and  without cough on insp or exp maneuvers  and mild  Hyperresonant  to  percussion bilaterally     CV:  RRR  no s3 or murmur or increase in P2, and no edema   ABD:  soft and nontender with pos end  insp Hoover's  in the supine position. No bruits or organomegaly appreciated, bowel sounds nl  MS:   Nl gait/  ext warm without deformities, calf tenderness, cyanosis or clubbing No obvious joint restrictions   SKIN: warm and dry without lesions    NEURO:  alert, approp, nl sensorium with  no motor or cerebellar deficits apparent.         CXR PA and Lateral:   06/30/2020 :    I personally reviewed images and  impression as follows:   Mod severe copd   Labs ordered 06/30/2020  :  allergy profile    Assessment:

## 2020-07-05 LAB — IGE: IgE (Immunoglobulin E), Serum: 115 IU/mL (ref 6–495)

## 2020-07-10 ENCOUNTER — Telehealth: Payer: Self-pay | Admitting: Internal Medicine

## 2020-07-10 NOTE — Telephone Encounter (Signed)
Nyoka Cowden, MD  07/07/2020 7:11 AM EST      Call patient : Studies are c/w mild allergies, but no need to change rx for now. Be sure patient has/keeps f/u ov so we can go over all the details of this study and get a plan together moving forward - ok to move up f/u if not feeling better and wants to be seen sooner   Spoke with patient. He verbalized understanding of results. Nothing further needed at time of call.

## 2020-08-07 ENCOUNTER — Ambulatory Visit: Payer: BLUE CROSS/BLUE SHIELD | Admitting: Internal Medicine

## 2020-08-07 ENCOUNTER — Encounter: Payer: Self-pay | Admitting: Internal Medicine

## 2020-08-07 ENCOUNTER — Other Ambulatory Visit: Payer: Self-pay

## 2020-08-07 DIAGNOSIS — J449 Chronic obstructive pulmonary disease, unspecified: Secondary | ICD-10-CM

## 2020-08-07 MED ORDER — MONTELUKAST SODIUM 10 MG PO TABS: 10.0000 mg | ORAL_TABLET | Freq: Every day | ORAL | 11 refills | Status: DC

## 2020-08-07 NOTE — Progress Notes (Signed)
Subjective:    Patient ID: [name], male   DOB: Apr 13, 1956   MRN: [id number]   Brief patient profile:  64 yowm quit smoking in 1999 an FEV1 of 45% predicted, documented in December of 2007 c/w GOLD III with a lot of reversibility  Pt had allergies and asthma as child no def improvement on allergy shots     History of Present Illness  April 20, 2009 no limitations in breathing, no nocturnal resp co's, the best he's done in years.  rec work on inhaler technique no change in rx.     03/31/2013 f/u ov/Wert re:  COPD  Chief Complaint  Patient presents with  . Acute Visit    Pt states developed head cold 2 wks ago and 1 wk ago started having increased SOB, chest congestion and prod cough with minimal clear sputum. Using proair twice per day.   Had previously reduced to rare saba and now at least twice daily but not typically while sleeping. Has mucinex dm rec from last avs but not using it for cough as per action plan. No purulent sputum  rec Prednisone 10 mg take  4 each am x 2 days,   2 each am x 2 days,  1 each am x 2 days and stop  For cough mucinex dm 1200 mg every 12 hours  When flare with respiratory problems prilosec 20 mg Take 30- 60 min before your first and last meals of the day  Please schedule a follow up visit in 3 months but call sooner if needed with pfts> not done    11/01/2013 f/u ov/Wert re: COPD with asthma overlap Chief Complaint  Patient presents with  . Follow-up    Pt states that overall his breathing is doing well.  No new co's today.  He used rescue inhaler a few times 1 day ago due to chest tightness that he related to the weather.   typically Not limited by breathing from desired activities  But not aerobically active rec Try spiriva respimat 2 puffs each am vs handhaler(powder) and see if you like it better and call us with your preference in one week so we can get you a year supply No change on symbicort 160 Take 2 puffs first thing in am and then  another 2 puffs about 12 hours later.  Only use your albuterol as a rescue medication       03/14/2015  f/u ov/Wert re: copd/asthma overlap/ on symbicort / spiriva  - likes the respimat better but never purchased it Chief Complaint  Patient presents with  . Follow-up    Pt states that his breathing is doing well. He c/o minimal congestion after having a "cold". He uses albuterol inhaler 1-2 x per wk.   Has been able to run  one half mile on treadmill at 4.5 mph no grade  but mostly just walking   Flat and up to 5 miles a day  rec Change to spiriva respimat plus continue symbicort 160     06/30/2020   ov/[place] office/Wert re: copd to re-establish sp aecopd Nov 2021 req sev courses prednisone Chief Complaint  Patient presents with  . Consult    Shortness of breath with activity  Dyspnea:  No neighborhood walking , can't chop wood  like used to s resting  Cough: first thing in am thick white mucus Sleeping: on side/ bed is flat  SABA use: once a day  02: none  rec Plan A = Automatic =  Always=   Breztri Take 2 puffs first thing in am and then another 2 puffs about 12 hours later.  Work on inhaler technique:    Plan B = Backup (to supplement plan A, not to replace it) Only use your albuterol inhaler as a rescue medication    Also ok Try albuterol 15 min before an activity that you know would make you short of breath       08/07/2020  f/u ov/[place] office/Wert re:  Copd  III  breztri Chief Complaint  Patient presents with  . Follow-up    Breathing has improved slightly since the last visit. He is using his albuterol inhaler 4 x per wk on average. He has prod cough with clear sputum.    Dyspnea:  Able chops / walk neighborhood daily  Cough: just a couple of coughs in am  Sleeping: bed is flat/ one pillow / on sied  SABA use: as above,  02: none  Covid status: 5 months out J and J  Lung cancer screening: n/a     No obvious day to day or daytime variability or assoc  excess/ purulent sputum or mucus plugs or hemoptysis or cp or chest tightness, subjective wheeze or overt sinus or hb symptoms.   Sleeping  without nocturnal  or early am exacerbation  of respiratory  c/o's or need for noct saba. Also denies any obvious fluctuation of symptoms with weather or environmental changes or other aggravating or alleviating factors except as outlined above   No unusual exposure hx or h/o childhood pna/ asthma or knowledge of premature birth.  Current Allergies, Complete Past Medical History, Past Surgical History, Family History, and Social History were reviewed in [employer] record.  ROS  The following are not active complaints unless bolded Hoarseness, sore throat, dysphagia, dental problems, itching, sneezing,  nasal congestion or discharge of excess mucus or purulent secretions, ear ache,   fever, chills, sweats, unintended wt loss or wt gain, classically pleuritic or exertional cp,  orthopnea pnd or arm/hand swelling  or leg swelling, presyncope, palpitations, abdominal pain, anorexia, nausea, vomiting, diarrhea  or change in bowel habits or change in bladder habits, change in stools or change in urine, dysuria, hematuria,  rash, arthralgias, visual complaints, headache, numbness, weakness or ataxia or problems with walking or coordination,  change in mood or  memory.        Current Meds  Medication Sig  . albuterol (PROAIR HFA) 108 (90 BASE) MCG/ACT inhaler 2 puffs every 6 hours as needed .2 puffs every 4 hours as needed only  if your can't catch your breath  . Budeson-Glycopyrrol-Formoterol (BREZTRI AEROSPHERE) 160-9-4.8 MCG/ACT AERO Inhale 2 puffs into the lungs 2 (two) times daily.  . cetirizine (ZYRTEC) 10 MG tablet Take 10 mg by mouth at bedtime.  Marland Kitchen dextromethorphan-guaiFENesin (MUCINEX DM) 30-600 MG per 12 hr tablet Take 1 tablet by mouth every 12 (twelve) hours as needed for cough.  . famotidine (PEPCID) 20 MG tablet One after supper   . hydrOXYzine (ATARAX/VISTARIL) 10 MG tablet TAKE 1-2 TABLETS BY MOUTH DAILY AT BEDTIME AS NEEDED FOR ITCHING  . Multiple Vitamin (MULTIVITAMIN) capsule Take 1 capsule by mouth daily.  . pantoprazole (PROTONIX) 40 MG tablet Take 1 tablet (40 mg total) by mouth daily. Take 30-60 min before first meal of the day                Past Medical History:  GERD  COPD/AB................................................................................................Marland KitchenDr [name]  -quit smoking in  1999 . Smoked 1 pack a day for 20-25 years  - FEV1 of 45% of the predicted, documented in December of 2007.  - FEV1 48% November 10, 2008 with ratio 49  - Alpha1 AT nl 10/26/2007  - HFA 75% November 10, 2008 > 90% April 20, 2009 > 100% Oct 10, 2009  - Xolair stopped after approx 3 years June  2010 > no perceived benefit by pt  -Pneumovax 12/20/2010           Objective:   Physical Exam  08/07/2020        164 06/30/2020      168 03/14/2015    169 03/31/2013    174  wt 180 Oct 10, 2009 > 182 November 13, 2009 > 188 June 14, 2010 > 12/20/2010  175 > 163 10/21/2012     Vital signs reviewed  08/07/2020  - Note at rest 02 sats  91% on RA   General appearance:    amb wm nad      HEENT : pt wearing mask not removed for exam due to covid - 19 concerns.    NECK :  without JVD/Nodes/TM/ nl carotid upstrokes bilaterally   LUNGS: no acc muscle use,  Mild barrel  contour chest wall with bilateral  Distant bs s audible wheeze and  without cough on insp or exp maneuvers  and mild  Hyperresonant  to  percussion bilaterally     CV:  RRR  no s3 or murmur or increase in P2, and no edema   ABD:  soft and nontender with pos end  insp Hoover's  in the supine position. No bruits or organomegaly appreciated, bowel sounds nl  MS:   Nl gait/  ext warm without deformities, calf tenderness, cyanosis or clubbing No obvious joint restrictions   SKIN: warm and dry without lesions    NEURO:  alert, approp, nl sensorium with  no  motor or cerebellar deficits apparent.           Assessment:

## 2020-08-07 NOTE — Assessment & Plan Note (Addendum)
Quit smoking in 1999 . Smoked 1 pack a day for 20-25 years  - FEV1 of 45% of the predicted, documented in December of 2007.  - FEV1 48% November 10, 2008 with ratio 49  - Alpha1AT  10/26/2007>>  nl / no phenotype one  - Xolair stopped after approx 3 years November 10, 2008> no benefit - changed spiriva to respimat .03/14/2015  - 03/14/2015  extensive coaching HFA effectiveness =    90%  - 06/30/2020  After extensive coaching inhaler device,  effectiveness =    90% changed to breztri 2 bid  - Allergy profile 06/30/2020 >  Eos 0.3/  IgE  115  - Singulair 10 mg one   Daily 08/07/2020 >>>   Concerned about allergy season but overall doing great c/w  Group D in terms of symptom/risk and laba/lama/ICS  therefore appropriate rx at this point >>>  breztri 2 bid   >>> add singulair as maint on trial basis thru typical allergy season and see if less prn zyrtec needed           Each maintenance medication was reviewed in detail including emphasizing most importantly the difference between maintenance and prns and under what circumstances the prns are to be triggered using an action plan format where appropriate.  Total time for H and P, chart review, counseling, reviewing hfa device(s) and generating customized AVS unique to this office visit / same day charting  > 30 min   - goal is to see once yearly using the "teach a man to fish concept" which I complete today with action plan as above

## 2020-08-07 NOTE — Patient Instructions (Signed)
Plan A = Automatic = Always=    Breztri and singulair 10 mg every day (ok to drop after allergy season)   Plan B = Backup for breating  (to supplement plan A, not to replace it) Only use your albuterol inhaler as a rescue medication to be used if you can't catch your breath by resting or doing a relaxed purse lip breathing pattern.  - The less you use it, the better it will work when you need it. - Ok to use the inhaler up to 2 puffs  every 4 hours if you must but call for appointment if use goes up over your usual need - Don't leave home without it !!  (think of it like the spare tire for your car)     Plan B for Allergies : itching sneezing running nose watery eyes > Zyrtec 10 mg one daily and add fluticasone as needed and consider seeing Dr Dellis Anes if not doing great    Please schedule a follow up visit in 12  months but call sooner if needed

## 2020-10-02 ENCOUNTER — Other Ambulatory Visit: Payer: Self-pay

## 2020-10-02 MED ORDER — PANTOPRAZOLE SODIUM 40 MG PO TBEC: 40.0000 mg | DELAYED_RELEASE_TABLET | Freq: Every day | ORAL | 10 refills | Status: DC

## 2020-10-04 ENCOUNTER — Ambulatory Visit: Payer: Self-pay | Admitting: Allergy & Immunology

## 2020-12-02 ENCOUNTER — Other Ambulatory Visit: Payer: Self-pay | Admitting: Internal Medicine

## 2021-05-30 ENCOUNTER — Other Ambulatory Visit: Payer: Self-pay | Admitting: Internal Medicine

## 2021-05-30 ENCOUNTER — Other Ambulatory Visit (HOSPITAL_COMMUNITY): Payer: Self-pay

## 2021-05-30 ENCOUNTER — Telehealth: Payer: Self-pay | Admitting: Internal Medicine

## 2021-05-30 ENCOUNTER — Telehealth: Payer: Self-pay

## 2021-05-30 NOTE — Telephone Encounter (Signed)
Called and spoke with patient, he states when he went to the pharmacy to pickup his Markus Daft he was told it would cost $534, it is not covered by his insurance.  Advised patient to call his insurance company and find out what is on their preferred list and then call us with the list.  He said he is almost out of the [place], but still has Symbicort and Spiriva he can use until we straighten all this out.  Will await return call with covered inhalers.  Dr. [name], this is just an FYI.  Thank you.

## 2021-05-30 NOTE — Telephone Encounter (Signed)
Patient Advocate Encounter   Received notification from patient calls that prior authorization for Breztri 160-9-4.10mcg is required by his/her insurance [employer].   PA submitted on 05/30/21  Key#: [id number]  Status is pending    [facility] will continue to follow:  Patient Advocate Fax:  [phone]

## 2021-05-31 NOTE — Telephone Encounter (Signed)
Patient Advocate Encounter  Received notification from [employer] that the request for prior authorization for [name] has been denied due to the patient not trying the preferred alt., Trelegy.     This encounter will continue to be updated until final determination.    Specialty Pharmacy Patient Advocate Fax:  [phone]

## 2021-05-31 NOTE — Telephone Encounter (Signed)
Dr. [name] please advise if you are ok with patient trying Trelegy

## 2021-06-01 MED ORDER — TRELEGY ELLIPTA 100-62.5-25 MCG/ACT IN AEPB: 1.0000 | INHALATION_SPRAY | Freq: Every day | RESPIRATORY_TRACT | 5 refills | Status: DC

## 2021-06-01 NOTE — Telephone Encounter (Signed)
Called and spoke with pt letting him know that MW said it was okay to send Rx for Trelegy to the pharmacy for him and also stated that MW wanted him to f/u in 6 weeks after starting Trelegy. Pt verbalized understanding. Rx has been sent to pharmacy for pt and OV has been scheduled. Nothing further needed.

## 2021-06-01 NOTE — Telephone Encounter (Signed)
It's fine - trelegy 100 one click each am but make sure has f/u ov w/in 6 weeks with inhalers in hand to verify technique/ efficacy

## 2021-06-01 NOTE — Telephone Encounter (Signed)
Noticed that this was a duplicate encounter after it was sent due to a PA encounter that had been created for [facility]. Closing this encounter so we can refer back to the PA encounter with that documentation.

## 2021-06-01 NOTE — Addendum Note (Signed)
Addended by: Wyvonne Lenz on: 06/01/2021 09:20 AM   Modules accepted: Orders

## 2021-06-01 NOTE — Telephone Encounter (Signed)
Called and spoke with pt who states when he was first put on the [place], he was payinf $40-$60 for the inhaler but states that the cost is about to go over $500.  Pt said that he called his insurance about this and was told by them that they have never paid for his Markus Daft so he is unsure if he might have had a copay card when he was first put on the [place].  Pt stated that his insurance told him that their recommendation was Trelegy and they stated to him that he might try to see if he is able to get a copay card for it, which pt stated that he was able to get for the Trelegy.  Due to the cost that the Markus Daft is about to go up to, pt wants to know what could be recommended. Dr. [name], please advise.

## 2021-07-10 ENCOUNTER — Other Ambulatory Visit: Payer: Self-pay

## 2021-07-10 ENCOUNTER — Encounter: Payer: Self-pay | Admitting: Internal Medicine

## 2021-07-10 ENCOUNTER — Ambulatory Visit: Payer: BC Managed Care – PPO | Admitting: Internal Medicine

## 2021-07-10 DIAGNOSIS — J310 Chronic rhinitis: Secondary | ICD-10-CM | POA: Diagnosis not present

## 2021-07-10 DIAGNOSIS — J449 Chronic obstructive pulmonary disease, unspecified: Secondary | ICD-10-CM

## 2021-07-10 MED ORDER — BREZTRI AEROSPHERE 160-9-4.8 MCG/ACT IN AERO: 2.0000 | INHALATION_SPRAY | Freq: Two times a day (BID) | RESPIRATORY_TRACT | 0 refills | Status: DC

## 2021-07-10 MED ORDER — ALBUTEROL SULFATE HFA 108 (90 BASE) MCG/ACT IN AERS: INHALATION_SPRAY | RESPIRATORY_TRACT | 3 refills | Status: DC

## 2021-07-10 MED ORDER — BREZTRI AEROSPHERE 160-9-4.8 MCG/ACT IN AERO: 2.0000 | INHALATION_SPRAY | Freq: Two times a day (BID) | RESPIRATORY_TRACT | 11 refills | Status: DC

## 2021-07-10 NOTE — Progress Notes (Signed)
Subjective:    Patient ID: [name], male   DOB: Nov 10, 1955   MRN: [id number]   Brief patient profile:  42   yowm quit smoking in 1999 an FEV1 of 45% predicted, documented in December of 2007 c/w GOLD III with a lot of reversibility  Pt had allergies and asthma as child no def improvement on allergy shots     History of Present Illness  April 20, 2009 no limitations in breathing, no nocturnal resp co's, the best he's done in years.  rec work on inhaler technique no change in rx.     03/31/2013 f/u ov/[name] re:  COPD  Chief Complaint  Patient presents with   Acute Visit    Pt states developed head cold 2 wks ago and 1 wk ago started having increased SOB, chest congestion and prod cough with minimal clear sputum. Using proair twice per day.   Had previously reduced to rare saba and now at least twice daily but not typically while sleeping. Has mucinex dm rec from last avs but not using it for cough as per action plan. No purulent sputum  rec Prednisone 10 mg take  4 each am x 2 days,   2 each am x 2 days,  1 each am x 2 days and stop  For cough mucinex dm 1200 mg every 12 hours  When flare with respiratory problems prilosec 20 mg Take 30- 60 min before your first and last meals of the day  Please schedule a follow up visit in 3 months but call sooner if needed with pfts> not done    11/01/2013 f/u ov/[name] re: COPD with asthma overlap Chief Complaint  Patient presents with   Follow-up    Pt states that overall his breathing is doing well.  No new co's today.  He used rescue inhaler a few times 1 day ago due to chest tightness that he related to the weather.   typically Not limited by breathing from desired activities  But not aerobically active rec Try spiriva respimat 2 puffs each am vs handhaler(powder) and see if you like it better and call us with your preference in one week so we can get you a year supply No change on symbicort 160 Take 2 puffs first thing in am and then  another 2 puffs about 12 hours later.  Only use your albuterol as a rescue medication       03/14/2015  f/u ov/[name] re: copd/asthma overlap/ on symbicort / spiriva  - likes the respimat better but never purchased it Chief Complaint  Patient presents with   Follow-up    Pt states that his breathing is doing well. He c/o minimal congestion after having a "cold". He uses albuterol inhaler 1-2 x per wk.   Has been able to run  one half mile on treadmill at 4.5 mph no grade  but mostly just walking   Flat and up to 5 miles a day  rec Change to spiriva respimat plus continue symbicort 160     06/30/2020   ov/[name] re: copd to re-establish sp aecopd Nov 2021 req sev courses prednisone Chief Complaint  Patient presents with   Consult    Shortness of breath with activity  Dyspnea:  No neighborhood walking , can't chop wood  like used to s resting  Cough: first thing in am thick white mucus Sleeping: on side/ bed is flat  SABA use: once a day  02: none  rec Plan A =  Automatic = Always=   Breztri Take 2 puffs first thing in am and then another 2 puffs about 12 hours later.  Work on inhaler technique:    Plan B = Backup (to supplement plan A, not to replace it) Only use your albuterol inhaler as a rescue medication    Also ok Try albuterol 15 min before an activity that you know would make you short of breath       08/07/2020  f/u ov/[name] re:  Copd  III  breztri Chief Complaint  Patient presents with   Follow-up    Breathing has improved slightly since the last visit. He is using his albuterol inhaler 4 x per wk on average. He has prod cough with clear sputum.    Dyspnea:  Able chops / walk neighborhood daily  Cough: just a couple of coughs in am  Sleeping: bed is flat/ one pillow / on sied  SABA use: as above,  02: none  Covid status: 5 months out J and J   Rec Plan A = Automatic = Always=    Breztri and singulair 10 mg every day (ok to drop after  allergy season)  Plan B = Backup for breating  (to supplement plan A, not to replace it) Only use your albuterol inhaler as a rescue medication   Plan B for Allergies : itching sneezing running nose watery eyes > Zyrtec 10 mg one daily and add fluticasone as needed and consider seeing Dr Dellis Anes if not doing great    07/10/2021  f/u ov/[name] re: GOLD 3   maint on trelegy 100 / montelukast  > nasal symptoms better but also still on dupixent/ zyrtec ? If needs all 3 ?  Chief Complaint  Patient presents with   Follow-up    Breathing not doing as well since switched from [facility] to Trelegy. He has occ chest tightness. He is using his rescue inhaler about once per wk.   Dyspnea:  not tol runny dog as well as was on breztri  Cough: better with zyrtec Sleeping: no resp symptoms / bed is flat/ on side/one pillow  SABA use: rarely  02: none  Covid status:   J/J    No obvious day to day or daytime variability or assoc excess/ purulent sputum or mucus plugs or hemoptysis or cp or chest tightness, subjective wheeze or overt sinus or hb symptoms.   sleeping without nocturnal  or early am exacerbation  of respiratory  c/o's or need for noct saba. Also denies any obvious fluctuation of symptoms with weather or environmental changes or other aggravating or alleviating factors except as outlined above   No unusual exposure hx or h/o childhood pna/ asthma or knowledge of premature birth.  Current Allergies, Complete Past Medical History, Past Surgical History, Family History, and Social History were reviewed in [employer] record.  ROS  The following are not active complaints unless bolded Hoarseness, sore throat, dysphagia, dental problems, itching, sneezing,  nasal congestion or discharge of excess mucus or purulent secretions, ear ache,   fever, chills, sweats, unintended wt loss or wt gain, classically pleuritic or exertional cp,  orthopnea pnd or arm/hand swelling  or leg  swelling, presyncope, palpitations, abdominal pain, anorexia, nausea, vomiting, diarrhea  or change in bowel habits or change in bladder habits, change in stools or change in urine, dysuria, hematuria,  rash, arthralgias, visual complaints, headache, numbness, weakness or ataxia or problems with walking or coordination,  change in mood or  memory.        Current Meds  Medication Sig   albuterol (PROAIR HFA) 108 (90 BASE) MCG/ACT inhaler 2 puffs every 6 hours as needed .2 puffs every 4 hours as needed only  if your can't catch your breath   cetirizine (ZYRTEC) 10 MG tablet Take 10 mg by mouth at bedtime.   dextromethorphan-guaiFENesin (MUCINEX DM) 30-600 MG per 12 hr tablet Take 1 tablet by mouth every 12 (twelve) hours as needed for cough.   Dupilumab (DUPIXENT [place]) Inject into the skin every 14 (fourteen) days.   famotidine (PEPCID) 20 MG tablet One after supper   Fluticasone-Umeclidin-Vilant (TRELEGY ELLIPTA) 100-62.5-25 MCG/ACT AEPB Inhale 1 puff into the lungs daily.   montelukast (SINGULAIR) 10 MG tablet Take 1 tablet (10 mg total) by mouth at bedtime.   Multiple Vitamin (MULTIVITAMIN) capsule Take 1 capsule by mouth daily.   pantoprazole (PROTONIX) 40 MG tablet TAKE 1 TABLET (40 MG TOTAL) BY MOUTH DAILY. TAKE 30-60 MIN BEFORE FIRST MEAL OF THE DAY               Past Medical History:  GERD  COPD/AB................................................................................................Marland KitchenDr Melvyn Novas  -quit smoking in 1999 . Smoked 1 pack a day for 20-25 years  - FEV1 of 45% of the predicted, documented in December of 2007.  - FEV1 48% November 10, 2008 with ratio 49  - Alpha1 AT nl 10/26/2007  - HFA 75% November 10, 2008 > 90% April 20, 2009 > 100% Oct 10, 2009  - Xolair stopped after approx 3 years June  2010 > no perceived benefit by pt  -Pneumovax 12/20/2010           Objective:   Physical Exam  Wt  07/10/2021         168  08/07/2020        164 06/30/2020      168 03/14/2015     169 03/31/2013    174  wt 180 Oct 10, 2009 > 182 November 13, 2009 > 188 June 14, 2010 > 12/20/2010  175 > 163 10/21/2012     Vital signs reviewed  07/10/2021  - Note at rest 02 sats  96% on RA   General appearance:    amb wm nad     HEENT : pt wearing mask not removed for exam due to covid - 19 concerns.    NECK :  without JVD/Nodes/TM/ nl carotid upstrokes bilaterally   LUNGS: no acc muscle use,  Mild barrel  contour chest wall with bilateral  Distant bs s audible wheeze and  without cough on insp or exp maneuvers  and mild  Hyperresonant  to  percussion bilaterally     CV:  RRR  no s3 or murmur or increase in P2, and no edema   ABD:  soft and nontender with pos end  insp Hoover's  in the supine position. No bruits or organomegaly appreciated, bowel sounds nl  MS:   Nl gait/  ext warm without deformities, calf tenderness, cyanosis or clubbing No obvious joint restrictions   SKIN: warm and dry without lesions    NEURO:  alert, approp, nl sensorium with  no motor or cerebellar deficits apparent.               Assessment:

## 2021-07-10 NOTE — Addendum Note (Signed)
Addended by: Christen Butter on: 07/10/2021 09:49 AM   Modules accepted: Orders

## 2021-07-10 NOTE — Patient Instructions (Addendum)
Ok to try albuterol 15 min before an activity (on alternating days)  that you know would usually make you short of breath and see if it makes any difference and if makes none then don't take albuterol after activity unless you can't catch your breath as this means it's the resting that helps, not the albuterol.      Prefer Breztri Take 2 puffs first thing in am and then another 2 puffs about 12 hours later.   Try off zyrtec to see if nasal symptoms flare and if so it probably means the singulair isn't working so stop it so resume zyrtec as needed   Please schedule a follow up visit in 12 months but call sooner if needed

## 2021-07-10 NOTE — Assessment & Plan Note (Signed)
Quit smoking in 1999 . Smoked 1 pack a day for 20-25 years  - FEV1 of 45% of the predicted, documented in December of 2007.  - FEV1 48% November 10, 2008 with ratio 49  - Alpha1AT  10/26/2007>>  nl / no phenotype done  - Xolair stopped after approx 3 years November 10, 2008> no benefit - changed spiriva to respimat .03/14/2015  - 03/14/2015  extensive coaching HFA effectiveness =    90%  - 06/30/2020  After extensive coaching inhaler device,  effectiveness =    90% changed to breztri 2 bid  - Allergy profile 06/30/2020 >  Eos 0.3/  IgE  115  - Singulair 10 mg one   Daily 08/07/2020 >>>  Improved 07/10/2021 but still on dupixent and zyrtec  - 07/10/2021 report doe worse on trelegy > changed back to breztri    Group D in terms of symptom/risk and laba/lama/ICS  therefore appropriate rx at this point >>>  Continue breztri and prn saba  Advised:  formulary restrictions will be an ongoing challenge for the forseable future and I would be happy to pick an alternative if the pt will first  provide me a list of them -  pt  will need to return here for training for any new device that is required eg dpi vs hfa vs respimat.    In the meantime we can always provide samples so that the patient never runs out of any needed respiratory medications.

## 2021-07-10 NOTE — Assessment & Plan Note (Signed)
-   Allergy profile 06/30/2020 >  Eos 0.3/  IgE  115  - Singulair 10 mg one   Daily 08/07/2020 >>>  Improved 07/10/2021 but still on dupixent and zyrtec    Advised to see if can stop zyrtec and if not then doubt singulair really helpin g  F/u in 12 m - sooner if neede         Each maintenance medication was reviewed in detail including emphasizing most importantly the difference between maintenance and prns and under what circumstances the prns are to be triggered using an action plan format where appropriate.  Total time for H and P, chart review, counseling, reviewing dpi/hfa device(s) and generating customized AVS unique to this office visit / same day charting = 25 min

## 2021-12-03 IMAGING — DX DG CHEST 2V
2 series · 2 of 2 positions shown · non-contrast
Comparison: None.

CLINICAL DATA: Shortness of breath.

EXAM:
CHEST - 2 VIEW

[chest pa]
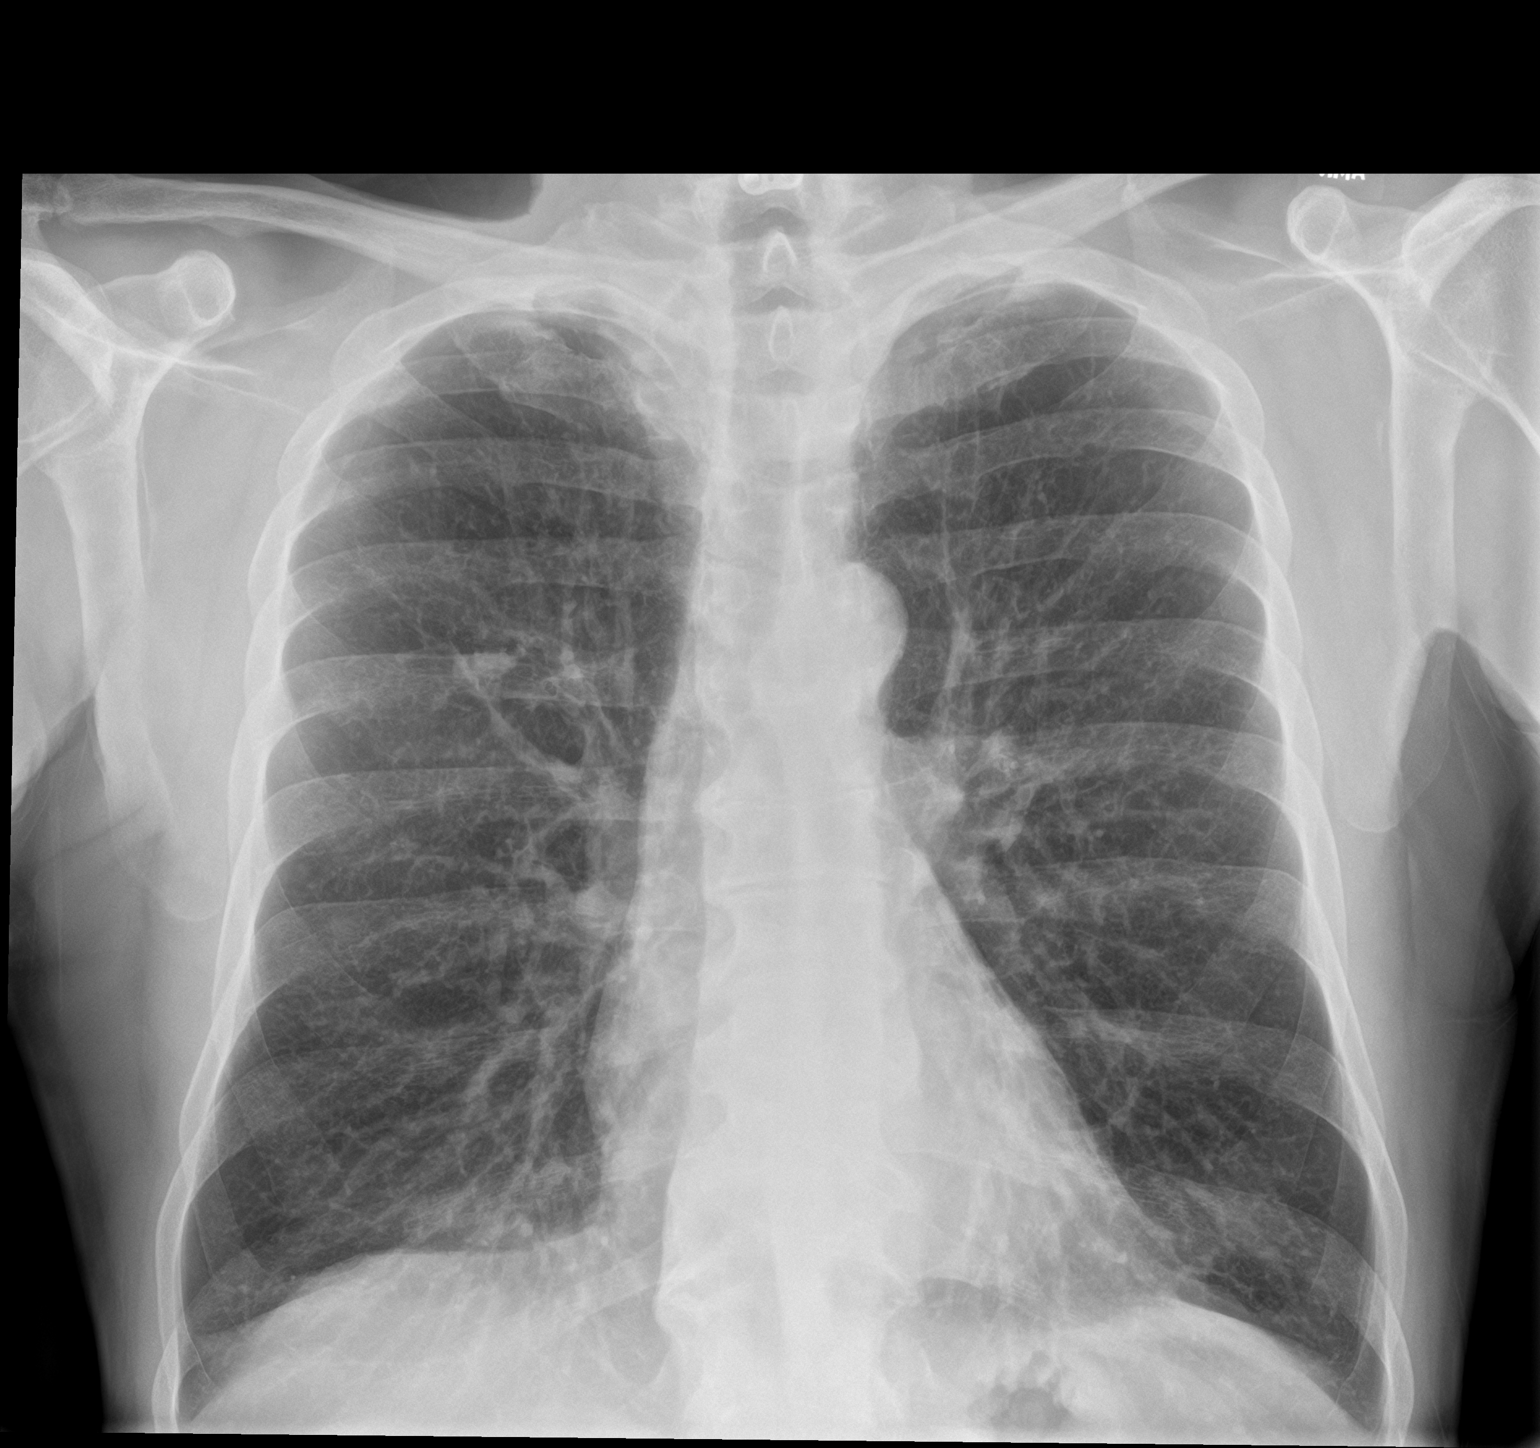

[chest lat]
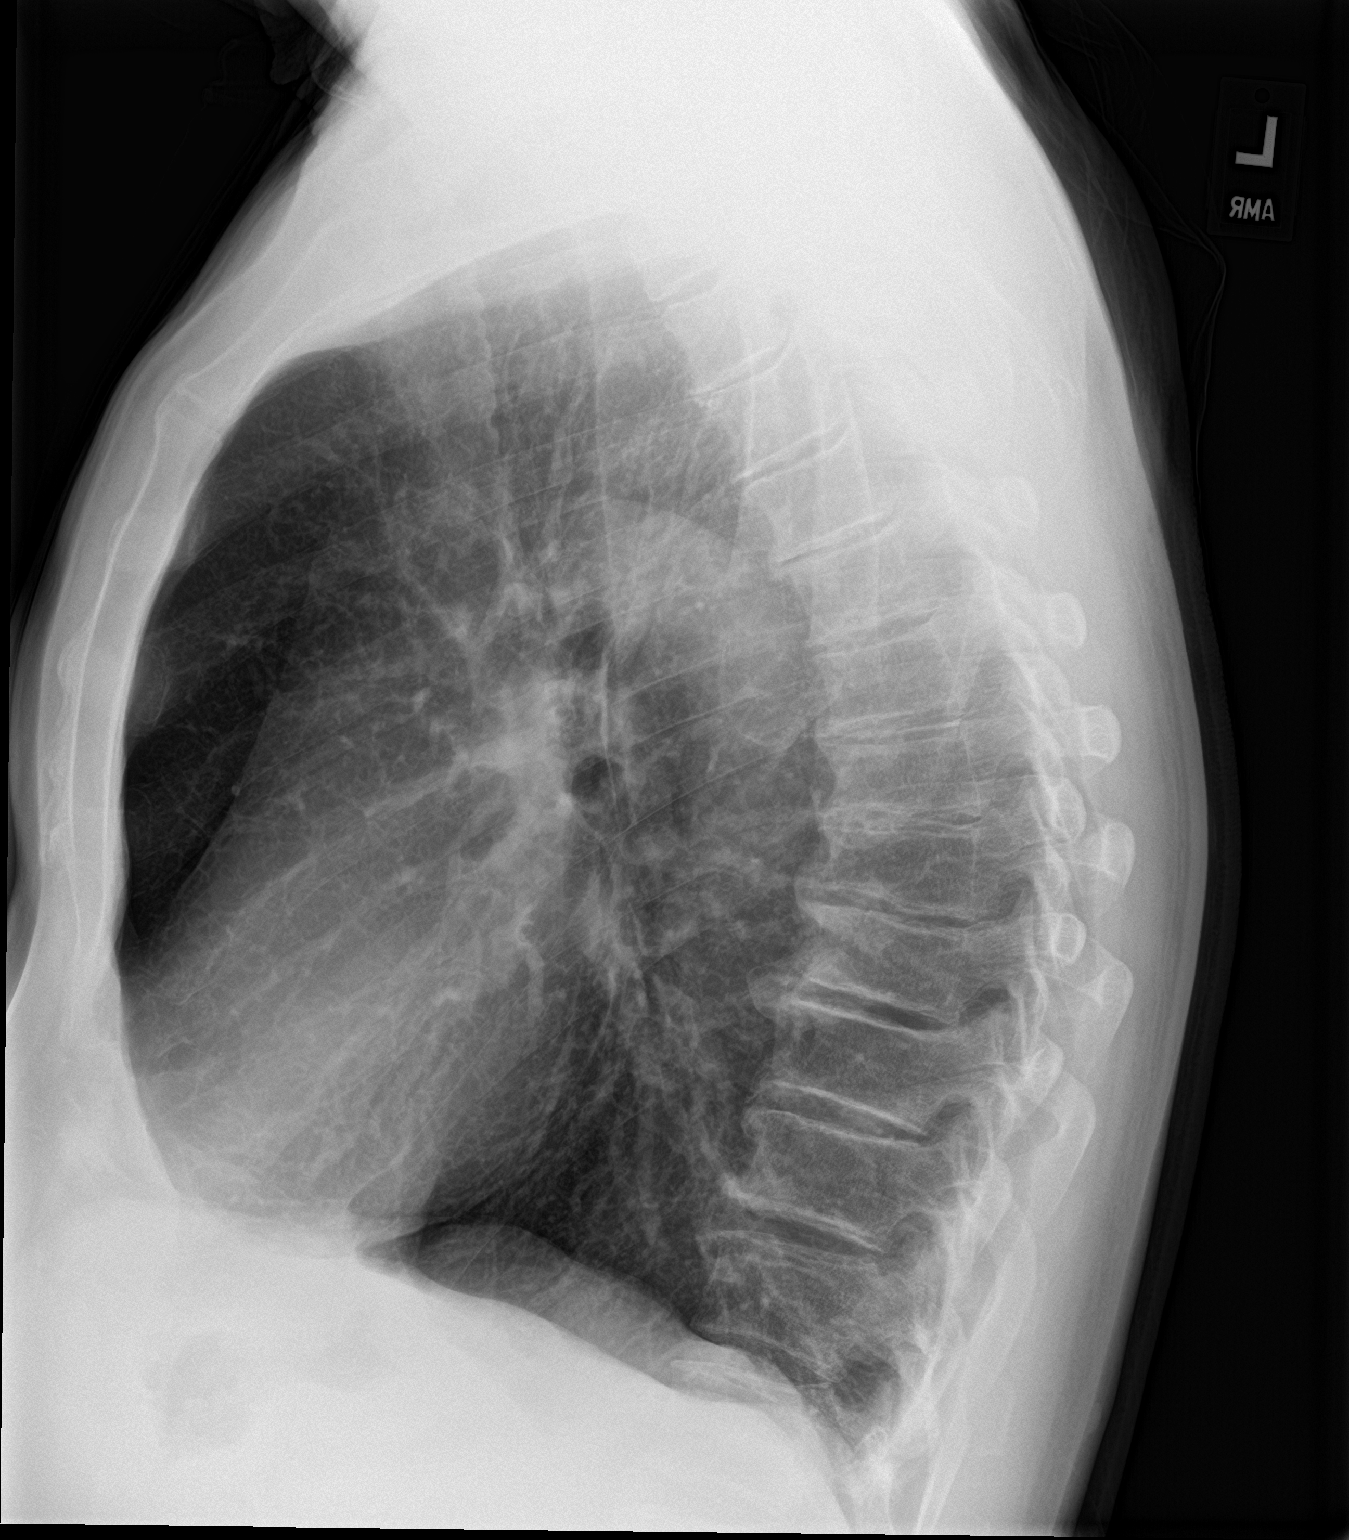

[2 of 2 positions shown; findings below may reference images not displayed]

FINDINGS: Heart size is normal. Lungs are clear. Changes of COPD are noted. No
edema or effusion is present. Nodular density in the right upper
lobe is stable over greater than 3 years. No other focal nodule or
mass lesion is present.
IMPRESSION: 1. No acute cardiopulmonary disease.
2. Stable nodule in the right upper lobe.  No follow-up necessary.

## 2022-01-03 ENCOUNTER — Other Ambulatory Visit: Payer: Self-pay | Admitting: *Deleted

## 2022-01-03 MED ORDER — PANTOPRAZOLE SODIUM 40 MG PO TBEC: 40.0000 mg | DELAYED_RELEASE_TABLET | Freq: Every day | ORAL | 4 refills | Status: DC

## 2022-07-10 ENCOUNTER — Ambulatory Visit: Payer: BC Managed Care – PPO | Admitting: Internal Medicine

## 2022-07-12 ENCOUNTER — Ambulatory Visit: Payer: BC Managed Care – PPO | Admitting: Internal Medicine

## 2022-07-18 ENCOUNTER — Encounter: Payer: Self-pay | Admitting: Internal Medicine

## 2022-07-18 ENCOUNTER — Ambulatory Visit: Payer: BC Managed Care – PPO | Admitting: Internal Medicine

## 2022-07-18 VITALS — BP 142/84 | HR 82 | Ht 69.0 in | Wt 171.6 lb

## 2022-07-18 DIAGNOSIS — J310 Chronic rhinitis: Secondary | ICD-10-CM

## 2022-07-18 DIAGNOSIS — J449 Chronic obstructive pulmonary disease, unspecified: Secondary | ICD-10-CM

## 2022-07-18 MED ORDER — ALBUTEROL SULFATE HFA 108 (90 BASE) MCG/ACT IN AERS: INHALATION_SPRAY | RESPIRATORY_TRACT | 11 refills | Status: DC

## 2022-07-18 MED ORDER — BREZTRI AEROSPHERE 160-9-4.8 MCG/ACT IN AERO: 2.0000 | INHALATION_SPRAY | Freq: Two times a day (BID) | RESPIRATORY_TRACT | 11 refills | Status: DC

## 2022-07-18 NOTE — Progress Notes (Signed)
Subjective:    Patient ID: [name], male   DOB: August 13, 1955   MRN: [id number]   Brief patient profile:  45  yowm quit smoking in 1999 an FEV1 of 45% predicted, documented in December of 2007 c/w GOLD III with a lot of reversibility  Pt had allergies and asthma as child no def improvement on allergy shots     History of Present Illness  April 20, 2009 no limitations in breathing, no nocturnal resp co's, the best he's done in years.  rec work on inhaler technique no change in rx.     03/31/2013 f/u ov/Daneille [name] re:  COPD  Chief Complaint  Patient presents with   Acute Visit    Pt states developed head cold 2 wks ago and 1 wk ago started having increased SOB, chest congestion and prod cough with minimal clear sputum. Using proair twice per day.   Had previously reduced to rare saba and now at least twice daily but not typically while sleeping. Has mucinex dm rec from last avs but not using it for cough as per action plan. No purulent sputum  rec Prednisone 10 mg take  4 each am x 2 days,   2 each am x 2 days,  1 each am x 2 days and stop  For cough mucinex dm 1200 mg every 12 hours  When flare with respiratory problems prilosec 20 mg Take 30- 60 min before your first and last meals of the day  Please schedule a follow up visit in 3 months but call sooner if needed with pfts> not done    11/01/2013 f/u ov/[name] re: COPD with asthma overlap Chief Complaint  Patient presents with   Follow-up    Pt states that overall his breathing is doing well.  No new co's today.  He used rescue inhaler a few times 1 day ago due to chest tightness that he related to the weather.   typically Not limited by breathing from desired activities  But not aerobically active rec Try spiriva respimat 2 puffs each am vs handhaler(powder) and see if you like it better and call us with your preference in one week so we can get you a year supply No change on symbicort 160 Take 2 puffs first thing in am and then  another 2 puffs about 12 hours later.  Only use your albuterol as a rescue medication       03/14/2015  f/u ov/[name] re: copd/asthma overlap/ on symbicort / spiriva  - likes the respimat better but never purchased it Chief Complaint  Patient presents with   Follow-up    Pt states that his breathing is doing well. He c/o minimal congestion after having a "cold". He uses albuterol inhaler 1-2 x per wk.   Has been able to run  one half mile on treadmill at 4.5 mph no grade  but mostly just walking   Flat and up to 5 miles a day  rec Change to spiriva respimat plus continue symbicort 160     06/30/2020   ov/[name]/Senita Corredor re: copd to re-establish sp aecopd Nov 2021 req sev courses prednisone Chief Complaint  Patient presents with   Consult    Shortness of breath with activity  Dyspnea:  No neighborhood walking , can't chop wood  like used to s resting  Cough: first thing in am thick white mucus Sleeping: on side/ bed is flat  SABA use: once a day  02: none  rec Plan A =  Automatic = Always=   Breztri Take 2 puffs first thing in am and then another 2 puffs about 12 hours later.  Work on inhaler technique:    Plan B = Backup (to supplement plan A, not to replace it) Only use your albuterol inhaler as a rescue medication    Also ok Try albuterol 15 min before an activity that you know would make you short of breath       08/07/2020  f/u ov/[name]/[name] re:  Copd  III  breztri Chief Complaint  Patient presents with   Follow-up    Breathing has improved slightly since the last visit. He is using his albuterol inhaler 4 x per wk on average. He has prod cough with clear sputum.    Dyspnea:  Able chops / walk neighborhood daily  Cough: just a couple of coughs in am  Sleeping: bed is flat/ one pillow / on sied  SABA use: as above,  02: none  Covid status: 5 months out J and J   Rec Plan A = Automatic = Always=    Breztri and singulair 10 mg every day (ok to drop after  allergy season)  Plan B = Backup for breating  (to supplement plan A, not to replace it) Only use your albuterol inhaler as a rescue medication   Plan B for Allergies : itching sneezing running nose watery eyes > Zyrtec 10 mg one daily and add fluticasone as needed and consider seeing Dr Ernst Bowler if not doing great    07/10/2021  f/u ov/[name] re: GOLD 3   maint on trelegy 100 / montelukast  > nasal symptoms better but also still on dupixent/ zyrtec ? If needs all 3 ?  Chief Complaint  Patient presents with   Follow-up    Breathing not doing as well since switched from [facility] to Trelegy. He has occ chest tightness. He is using his rescue inhaler about once per wk.   Dyspnea:  not tol running  dog as well as was on breztri  Cough: better with zyrtec Sleeping: no resp symptoms / bed is flat/ on side/one pillow  SABA use: rarely  02: none  Covid status:   J/J  Rec Ok to try albuterol 15 min before an activity (on alternating days)  that you know would usually make you short of breath   Prefer Breztri Take 2 puffs first thing in am and then another 2 puffs about 12 hours later.  Try off zyrtec to see if nasal symptoms flare and if so it probably means the singulair isn't working so stop it so resume zyrtec as needed     07/18/2022  f/u ov/[name]/[name] re: GOLD 3  maint on breztri /dupixent / no longer on singulair /  Chief Complaint  Patient presents with   Follow-up    No new concerns   Dyspnea:  chasing dogs better  Cough: none  Sleeping: level bed/ 1 pillow s resp cc  SABA use: avg once a week  02: none  Covid status: JJ     No obvious day to day or daytime variability or assoc excess/ purulent sputum or mucus plugs or hemoptysis or cp or chest tightness, subjective wheeze or overt sinus or hb symptoms.   Sleeping  without nocturnal  or early am exacerbation  of respiratory  c/o's or need for noct saba. Also denies any obvious fluctuation of symptoms with weather or  environmental changes or other aggravating or alleviating factors except as  outlined above   No unusual exposure hx or h/o childhood pna  or knowledge of premature birth.  Current Allergies, Complete Past Medical History, Past Surgical History, Family History, and Social History were reviewed in [employer] record.  ROS  The following are not active complaints unless bolded Hoarseness, sore throat, dysphagia, dental problems, itching, sneezing,  nasal congestion or discharge of excess mucus or purulent secretions, ear ache,   fever, chills, sweats, unintended wt loss or wt gain, classically pleuritic or exertional cp,  orthopnea pnd or arm/hand swelling  or leg swelling, presyncope, palpitations, abdominal pain, anorexia, nausea, vomiting, diarrhea  or change in bowel habits or change in bladder habits, change in stools or change in urine, dysuria, hematuria,  rash, arthralgias, visual complaints, headache, numbness, weakness or ataxia or problems with walking or coordination,  change in mood or  memory.        Current Meds  Medication Sig   albuterol (PROAIR HFA) 108 (90 Base) MCG/ACT inhaler 2 puffs every 6 hours as needed .2 puffs every 4 hours as needed only  if your can't catch your breath   Budeson-Glycopyrrol-Formoterol (BREZTRI AEROSPHERE) 160-9-4.8 MCG/ACT AERO Inhale 2 puffs into the lungs 2 (two) times daily.   Budeson-Glycopyrrol-Formoterol (BREZTRI AEROSPHERE) 160-9-4.8 MCG/ACT AERO Inhale 2 puffs into the lungs in the morning and at bedtime.   cetirizine (ZYRTEC) 10 MG tablet Take 10 mg by mouth at bedtime.   dextromethorphan-guaiFENesin (MUCINEX DM) 30-600 MG per 12 hr tablet Take 1 tablet by mouth every 12 (twelve) hours as needed for cough.   Dupilumab (DUPIXENT [place]) Inject into the skin every 14 (fourteen) days.   famotidine (PEPCID) 20 MG tablet One after supper   montelukast (SINGULAIR) 10 MG tablet Take 1 tablet (10 mg total) by mouth at bedtime.    Multiple Vitamin (MULTIVITAMIN) capsule Take 1 capsule by mouth daily.   pantoprazole (PROTONIX) 40 MG tablet Take 1 tablet (40 mg total) by mouth daily. Take 30-60 min before first meal of the day                Past Medical History:  GERD  COPD/AB................................................................................................Marland KitchenDr Melvyn Novas  -quit smoking in 1999 . Smoked 1 pack a day for 20-25 years  - FEV1 of 45% of the predicted, documented in December of 2007.  - FEV1 48% November 10, 2008 with ratio 49  - Alpha1 AT nl 10/26/2007  - HFA 75% November 10, 2008 > 90% April 20, 2009 > 100% Oct 10, 2009  - Xolair stopped after approx 3 years June  2010 > no perceived benefit by pt  -Pneumovax 12/20/2010           Objective:   Physical Exam  Wt  07/18/2022      171   07/10/2021        168  08/07/2020        164 06/30/2020      168 03/14/2015    169 03/31/2013    174  wt 180 Oct 10, 2009 > 182 November 13, 2009 > 188 June 14, 2010 > 12/20/2010  175 > 163 10/21/2012     Vital signs reviewed  07/18/2022  - Note at rest 02 sats  95% on RA   General appearance:    pleasant amb wm nad  HEENT : Oropharynx clear  Nasal turbinates nl    NECK :  without  apparent JVD/ palpable Nodes/TM    LUNGS: no acc muscle use,  Mild barrel  contour chest wall with bilateral  Distant bs s audible wheeze and  without cough on insp or exp maneuvers  and mild  Hyperresonant  to  percussion bilaterally     CV:  RRR  no s3 or murmur or increase in P2, and no edema   ABD:  soft and nontender with pos end  insp Hoover's  in the supine position.  No bruits or organomegaly appreciated   MS:  Nl gait/ ext warm without deformities Or obvious joint restrictions  calf tenderness, cyanosis or clubbing     SKIN: warm and dry without lesions    NEURO:  alert, approp, nl sensorium with  no motor or cerebellar deficits apparent.      Assessment:

## 2022-07-18 NOTE — Assessment & Plan Note (Signed)
-   Allergy profile 06/30/2020 >  Eos 0.3/  IgE  115  - Singulair 10 mg one   Daily 08/07/2020 >>>  Improved 07/10/2021 but still on dupixent and zyrtec  - off singulair since 07/2021 no flares while on dupixent reported 07/18/2022 > no change rx          Each maintenance medication was reviewed in detail including emphasizing most importantly the difference between maintenance and prns and under what circumstances the prns are to be triggered using an action plan format where appropriate.  Total time for H and P, chart review, counseling, reviewing hfa  device(s) and generating customized AVS unique to this office visit / same day charting = 25 min

## 2022-07-18 NOTE — Assessment & Plan Note (Signed)
Quit smoking in 1999 . Smoked 1 pack a day for 20-25 years  - FEV1 of 45% of the predicted, documented in December of 2007.  - FEV1 48% November 10, 2008 with ratio 49  - Alpha1AT  10/26/2007>>  nl / no phenotype done  - Xolair stopped after approx 3 years November 10, 2008> no benefit - changed spiriva to respimat .03/14/2015  - 03/14/2015  extensive coaching HFA effectiveness =    90%  - 06/30/2020  After extensive coaching inhaler device,  effectiveness =    90% changed to breztri 2 bid  - Allergy profile 06/30/2020 >  Eos 0.3/  IgE  115  - Singulair 10 mg one   Daily 08/07/2020 >>>  Improved 07/10/2021 but  on dupixent and zyrtec  - 07/10/2021 report doe worse on trelegy > changed back to breztri and off singulair - 07/18/2022 reports best ever on combination breztri/ dupixent (for skin)    Group D (now reclassified as E) in terms of symptom/risk and laba/lama/ICS  therefore appropriate rx at this point >>>  continue breztri and prn saba/ dupixent per dermatology  F/u yearly - call sooner if needed

## 2022-07-18 NOTE — Patient Instructions (Signed)
No change in medications   Please schedule a follow up visit in 12  months but call sooner if needed  

## 2023-01-17 ENCOUNTER — Other Ambulatory Visit: Payer: Self-pay | Admitting: Internal Medicine

## 2023-07-26 ENCOUNTER — Other Ambulatory Visit: Payer: Self-pay | Admitting: Internal Medicine

## 2023-08-03 NOTE — Progress Notes (Unsigned)
 Subjective:    Patient ID: [name], male   DOB: 11/20/1955   MRN: [id number]   Brief patient profile:  66  yowm quit smoking in 1999 an FEV1 of 45% predicted, documented in December of 2007 c/w GOLD III with a lot of reversibility  Pt had allergies and asthma as child no def improvement on allergy shots     History of Present Illness  April 20, 2009 no limitations in breathing, no nocturnal resp co's, the best he's done in years.  rec work on inhaler technique no change in rx.     03/31/2013 f/u ov/Sung Parodi re:  COPD  Chief Complaint  Patient presents with   Acute Visit    Pt states developed head cold 2 wks ago and 1 wk ago started having increased SOB, chest congestion and prod cough with minimal clear sputum. Using proair twice per day.   Had previously reduced to rare saba and now at least twice daily but not typically while sleeping. Has mucinex dm rec from last avs but not using it for cough as per action plan. No purulent sputum  rec Prednisone 10 mg take  4 each am x 2 days,   2 each am x 2 days,  1 each am x 2 days and stop  For cough mucinex dm 1200 mg every 12 hours  When flare with respiratory problems prilosec 20 mg Take 30- 60 min before your first and last meals of the day  Please schedule a follow up visit in 3 months but call sooner if needed with pfts> not done    11/01/2013 f/u ov/Lathon Adan re: COPD with asthma overlap Chief Complaint  Patient presents with   Follow-up    Pt states that overall his breathing is doing well.  No new co's today.  He used rescue inhaler a few times 1 day ago due to chest tightness that he related to the weather.   typically Not limited by breathing from desired activities  But not aerobically active rec Try spiriva respimat 2 puffs each am vs handhaler(powder) and see if you like it better and call us with your preference in one week so we can get you a year supply No change on symbicort 160 Take 2 puffs first thing in am and then  another 2 puffs about 12 hours later.  Only use your albuterol as a rescue medication       03/14/2015  f/u ov/Nakima Fluegge re: copd/asthma overlap/ on symbicort / spiriva  - likes the respimat better but never purchased it Chief Complaint  Patient presents with   Follow-up    Pt states that his breathing is doing well. He c/o minimal congestion after having a "cold". He uses albuterol inhaler 1-2 x per wk.   Has been able to run  one half mile on treadmill at 4.5 mph no grade  but mostly just walking   Flat and up to 5 miles a day  rec Change to spiriva respimat plus continue symbicort 160     06/30/2020   ov/[place] office/Hennessey Cantrell re: copd to re-establish sp aecopd Nov 2021 req sev courses prednisone Chief Complaint  Patient presents with   Consult    Shortness of breath with activity  Dyspnea:  No neighborhood walking , can't chop wood  like used to s resting  Cough: first thing in am thick white mucus Sleeping: on side/ bed is flat  SABA use: once a day  02: none  rec Plan A =  Automatic = Always=   Breztri Take 2 puffs first thing in am and then another 2 puffs about 12 hours later.  Work on inhaler technique:    Plan B = Backup (to supplement plan A, not to replace it) Only use your albuterol inhaler as a rescue medication    Also ok Try albuterol 15 min before an activity that you know would make you short of breath       08/07/2020  f/u ov/[place] office/Cheryll Keisler re:  Copd  III  breztri Chief Complaint  Patient presents with   Follow-up    Breathing has improved slightly since the last visit. He is using his albuterol inhaler 4 x per wk on average. He has prod cough with clear sputum.    Dyspnea:  Able chops / walk neighborhood daily  Cough: just a couple of coughs in am  Sleeping: bed is flat/ one pillow / on sied  SABA use: as above,  02: none  Covid status: 5 months out J and J   Rec Plan A = Automatic = Always=    Breztri and singulair 10 mg every day (ok to drop after  allergy season)  Plan B = Backup for breating  (to supplement plan A, not to replace it) Only use your albuterol inhaler as a rescue medication   Plan B for Allergies : itching sneezing running nose watery eyes > Zyrtec 10 mg one daily and add fluticasone as needed and consider seeing Dr Dellis Anes if not doing great    07/10/2021  f/u ov/Rishab Stoudt re: GOLD 3   maint on trelegy 100 / montelukast  > nasal symptoms better but also still on dupixent/ zyrtec ? If needs all 3 ?  Chief Complaint  Patient presents with   Follow-up    Breathing not doing as well since switched from [facility] to Trelegy. He has occ chest tightness. He is using his rescue inhaler about once per wk.   Dyspnea:  not tol running  dog as well as was on breztri  Cough: better with zyrtec Sleeping: no resp symptoms / bed is flat/ on side/one pillow  SABA use: rarely  02: none  Covid status:   J/J  Rec Ok to try albuterol 15 min before an activity (on alternating days)  that you know would usually make you short of breath   Prefer Breztri Take 2 puffs first thing in am and then another 2 puffs about 12 hours later.  Try off zyrtec to see if nasal symptoms flare and if so it probably means the singulair isn't working so stop it so resume zyrtec as needed     07/18/2022  f/u ov/[place] office/Ebin Palazzi re: GOLD 3  maint on breztri /dupixent / no longer on singulair /  Chief Complaint  Patient presents with   Follow-up    No new concerns   Dyspnea:  chasing dogs better  Cough: none  Sleeping: level bed/ 1 pillow s resp cc  SABA use: avg once a week  02: none  Covid status: JJ Rec     08/05/2023  f/u ov/[place] office/Wendelin Bradt re: GOLD 3 copd  maint on ***  No chief complaint on file.   Dyspnea:  *** Cough: *** Sleeping: ***   resp cc  SABA use: *** 02: ***  Lung cancer screening: ***   No obvious day to day or daytime variability or assoc excess/ purulent sputum or mucus plugs or hemoptysis or cp or chest tightness,  subjective wheeze or  overt sinus or hb symptoms.    Also denies any obvious fluctuation of symptoms with weather or environmental changes or other aggravating or alleviating factors except as outlined above   No unusual exposure hx or h/o childhood pna/ asthma or knowledge of premature birth.  Current Allergies, Complete Past Medical History, Past Surgical History, Family History, and Social History were reviewed in [employer] record.  ROS  The following are not active complaints unless bolded Hoarseness, sore throat, dysphagia, dental problems, itching, sneezing,  nasal congestion or discharge of excess mucus or purulent secretions, ear ache,   fever, chills, sweats, unintended wt loss or wt gain, classically pleuritic or exertional cp,  orthopnea pnd or arm/hand swelling  or leg swelling, presyncope, palpitations, abdominal pain, anorexia, nausea, vomiting, diarrhea  or change in bowel habits or change in bladder habits, change in stools or change in urine, dysuria, hematuria,  rash, arthralgias, visual complaints, headache, numbness, weakness or ataxia or problems with walking or coordination,  change in mood or  memory.        No outpatient medications have been marked as taking for the 08/05/23 encounter (Appointment) with Nyoka Cowden, MD.                  Past Medical History:  GERD  COPD/AB................................................................................................Marland KitchenDr [name]  -quit smoking in 1999 . Smoked 1 pack a day for 20-25 years  - FEV1 of 45% of the predicted, documented in December of 2007.  - FEV1 48% November 10, 2008 with ratio 49  - Alpha1 AT nl 10/26/2007  - HFA 75% November 10, 2008 > 90% April 20, 2009 > 100% Oct 10, 2009  - Xolair stopped after approx 3 years June  2010 > no perceived benefit by pt  -Pneumovax 12/20/2010           Objective:   Physical Exam  Wt  08/05/2023        ***  07/18/2022      171   07/10/2021         168  08/07/2020        164 06/30/2020      168 03/14/2015    169 03/31/2013    174  wt 180 Oct 10, 2009 > 182 November 13, 2009 > 188 June 14, 2010 > 12/20/2010  175 > 163 10/21/2012    Vital signs reviewed  08/05/2023  - Note at rest 02 sats  ***% on ***   General appearance:    ***    Mild bar***    Assessment:

## 2023-08-05 ENCOUNTER — Ambulatory Visit: Payer: BC Managed Care – PPO | Admitting: Internal Medicine

## 2023-08-05 ENCOUNTER — Telehealth: Payer: Self-pay | Admitting: Internal Medicine
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
## 2023-08-10 NOTE — Progress Notes (Unsigned)
 Subjective:    Patient ID: [name], male   DOB: February 21, 1956   MRN: [id number]   Brief patient profile:  67  yowm quit smoking in 1999 an FEV1 of 45% predicted, documented in December of 2007 c/w GOLD III with a lot of reversibility  Pt had allergies and asthma as child no def improvement on allergy shots     History of Present Illness  April 20, 2009 no limitations in breathing, no nocturnal resp co's, the best he's done in years.  rec work on inhaler technique no change in rx.     03/31/2013 f/u ov/[name] re:  COPD  Chief Complaint  Patient presents with   Acute Visit    Pt states developed head cold 2 wks ago and 1 wk ago started having increased SOB, chest congestion and prod cough with minimal clear sputum. Using proair twice per day.   Had previously reduced to rare saba and now at least twice daily but not typically while sleeping. Has mucinex dm rec from last avs but not using it for cough as per action plan. No purulent sputum  rec Prednisone 10 mg take  4 each am x 2 days,   2 each am x 2 days,  1 each am x 2 days and stop  For cough mucinex dm 1200 mg every 12 hours  When flare with respiratory problems prilosec 20 mg Take 30- 60 min before your first and last meals of the day  Please schedule a follow up visit in 3 months but call sooner if needed with pfts> not done        08/07/2020  f/u ov/[name]/[name] re:  Copd  III  breztri Chief Complaint  Patient presents with   Follow-up    Breathing has improved slightly since the last visit. He is using his albuterol inhaler 4 x per wk on average. He has prod cough with clear sputum.    Dyspnea:  Able chops / walk neighborhood daily  Cough: just a couple of coughs in am  Sleeping: bed is flat/ one pillow / on sied  SABA use: as above,  02: none  Covid status: 5 months out J and J   Rec Plan A = Automatic = Always=    Breztri and singulair 10 mg every day (ok to drop after allergy season)  Plan B  = Backup for breating  (to supplement plan A, not to replace it) Only use your albuterol inhaler as a rescue medication   Plan B for Allergies : itching sneezing running nose watery eyes > Zyrtec 10 mg one daily and add fluticasone as needed and consider seeing Dr Dellis Anes if not doing great    07/10/2021  f/u ov/[name] re: GOLD 3   maint on trelegy 100 / montelukast  > nasal symptoms better but also still on dupixent/ zyrtec ? If needs all 3 ?  Chief Complaint  Patient presents with   Follow-up    Breathing not doing as well since switched from [facility] to Trelegy. He has occ chest tightness. He is using his rescue inhaler about once per wk.   Dyspnea:  not tol running  dog as well as was on breztri  Cough: better with zyrtec Sleeping: no resp symptoms / bed is flat/ on side/one pillow  SABA use: rarely  02: none  Covid status:   J/J  Rec Ok to try albuterol 15 min before an activity (on alternating days)  that  you know would usually make you short of breath   Prefer Breztri Take 2 puffs first thing in am and then another 2 puffs about 12 hours later.  Try off zyrtec to see if nasal symptoms flare and if so it probably means the singulair isn't working so stop it so resume zyrtec as needed     07/18/2022  f/u ov/[name]/[name] re: GOLD 3  now maint on breztri /dupixent for skin / no longer on singulair /  Chief Complaint  Patient presents with   Follow-up    No new concerns   Dyspnea:  chasing dogs better  Cough: none  Sleeping: level bed/ 1 pillow s resp cc  SABA use: avg once a week  02: none  Rec No change in medications Please schedule a follow up visit in 12  months but call sooner if needed     08/11/2023  f/u ov/[name]/[name] re: GOLD 3 copd  maint on breztri and dupixent q 2 weeks   Chief Complaint  Patient presents with   Consult    Copd   Dyspnea:  walking dogs up to 30 min up some hills s difficulty Cough: none  Sleeping: one pillow/ level bed no  resp cc  SABA use: twice a month at most  02: none    No obvious day to day or daytime variability or assoc excess/ purulent sputum or mucus plugs or hemoptysis or cp or chest tightness, subjective wheeze or overt sinus or hb symptoms.    Also denies any obvious fluctuation of symptoms with weather or environmental changes or other aggravating or alleviating factors except as outlined above   No unusual exposure hx or h/o childhood pna/  or knowledge of premature birth.  Current Allergies, Complete Past Medical History, Past Surgical History, Family History, and Social History were reviewed in [employer] record.  ROS  The following are not active complaints unless bolded Hoarseness, sore throat, dysphagia, dental problems, itching, sneezing,  nasal congestion or discharge of excess mucus or purulent secretions, ear ache,   fever, chills, sweats, unintended wt loss or wt gain, classically pleuritic or exertional cp,  orthopnea pnd or arm/hand swelling  or leg swelling, presyncope, palpitations, abdominal pain, anorexia, nausea, vomiting, diarrhea  or change in bowel habits or change in bladder habits, change in stools or change in urine, dysuria, hematuria,  rash, arthralgias, visual complaints, headache, numbness, weakness or ataxia or problems with walking or coordination,  change in mood or  memory.        Current Meds  Medication Sig   albuterol (PROAIR HFA) 108 (90 Base) MCG/ACT inhaler 2 puffs every 6 hours as needed .2 puffs every 4 hours as needed only  if your can't catch your breath   Budeson-Glycopyrrol-Formoterol (BREZTRI AEROSPHERE) 160-9-4.8 MCG/ACT AERO Inhale 2 puffs into the lungs 2 (two) times daily.   cetirizine (ZYRTEC) 10 MG tablet Take 10 mg by mouth at bedtime.   dextromethorphan-guaiFENesin (MUCINEX DM) 30-600 MG per 12 hr tablet Take 1 tablet by mouth every 12 (twelve) hours as needed for cough.   Dupilumab (DUPIXENT [place]) Inject into the skin every  14 (fourteen) days.   Multiple Vitamin (MULTIVITAMIN) capsule Take 1 capsule by mouth daily.   pantoprazole (PROTONIX) 40 MG tablet TAKE 1 TABLET (40 MG TOTAL) BY MOUTH DAILY. TAKE 30-60 MIN BEFORE FIRST MEAL OF THE DAY                  Past  Medical History:  GERD  COPD/AB................................................................................................Marland KitchenDr [name]  -quit smoking in 1999 . Smoked 1 pack a day for 20-25 years  - FEV1 of 45% of the predicted, documented in December of 2007.  - FEV1 48% November 10, 2008 with ratio 49  - Alpha1 AT nl 10/26/2007  - HFA 75% November 10, 2008 > 90% April 20, 2009 > 100% Oct 10, 2009  - Xolair stopped after approx 3 years June  2010 > no perceived benefit by pt  -Pneumovax 12/20/2010           Objective:   Physical Exam  Wt  08/11/2023      168  07/18/2022      171   07/10/2021        168  08/07/2020        164 06/30/2020      168 03/14/2015    169 03/31/2013    174  wt 180 Oct 10, 2009 > 182 November 13, 2009 > 188 June 14, 2010 > 12/20/2010  175 > 163 10/21/2012    Vital signs reviewed  08/11/2023  - Note at rest 02 sats  95% on RA   General appearance:    amb wm nad    HEENT : Oropharynx  clear     NECK :  without  apparent JVD/ palpable Nodes/TM    LUNGS: no acc muscle use,  Min barrel  contour chest wall with bilateral  slightly decreased bs s audible wheeze and  without cough on insp or exp maneuvers and min  Hyperresonant  to  percussion bilaterally    CV:  RRR  no s3 or murmur or increase in P2, and no edema   ABD:  soft and nontender with pos end  insp Hoover's  in the supine position.  No bruits or organomegaly appreciated   MS:  Nl gait/ ext warm without deformities Or obvious joint restrictions  calf tenderness, cyanosis or clubbing     SKIN: warm and dry without lesions    NEURO:  alert, approp, nl sensorium with  no motor or cerebellar deficits apparent.          Assessment:

## 2023-08-11 ENCOUNTER — Encounter: Payer: Self-pay | Admitting: Internal Medicine

## 2023-08-11 ENCOUNTER — Ambulatory Visit (INDEPENDENT_AMBULATORY_CARE_PROVIDER_SITE_OTHER): Admitting: Internal Medicine

## 2023-08-11 VITALS — BP 147/81 | HR 68 | Ht 69.0 in | Wt 168.0 lb

## 2023-08-11 DIAGNOSIS — J449 Chronic obstructive pulmonary disease, unspecified: Secondary | ICD-10-CM | POA: Diagnosis not present

## 2023-08-11 DIAGNOSIS — J310 Chronic rhinitis: Secondary | ICD-10-CM

## 2023-08-11 MED ORDER — BREZTRI AEROSPHERE 160-9-4.8 MCG/ACT IN AERO: 2.0000 | INHALATION_SPRAY | Freq: Two times a day (BID) | RESPIRATORY_TRACT | 11 refills | Status: AC

## 2023-08-11 MED ORDER — ALBUTEROL SULFATE HFA 108 (90 BASE) MCG/ACT IN AERS: INHALATION_SPRAY | RESPIRATORY_TRACT | 11 refills | Status: AC

## 2023-08-11 NOTE — Assessment & Plan Note (Signed)
 Quit smoking in 1999 . Smoked 1 pack a day for 20-25 years  - FEV1 of 45% of the predicted, documented in December of 2007.  - FEV1 48% November 10, 2008 with ratio 49  - Alpha1AT  10/26/2007>>  nl / no phenotype done  - Xolair stopped after approx 3 years November 10, 2008> no benefit - changed spiriva to respimat .03/14/2015  - 03/14/2015  extensive coaching HFA effectiveness =    90%  - 06/30/2020  After extensive coaching inhaler device,  effectiveness =    90% changed to breztri 2 bid  - Allergy profile 06/30/2020 >  Eos 0.3/  IgE  115  - Singulair 10 mg one   Daily 08/07/2020 >>>  Improved 07/10/2021 but  on dupixent and zyrtec  - 07/10/2021 report doe worse on trelegy > changed back to breztri and off singulair - 07/18/2022 reports best ever on combination breztri/ dupixent (for skin)    Group D (now reclassified as E) in terms of symptom/risk and laba/lama/ICS  therefore appropriate rx at this point >>>  breztri 2bid/ dupixent for skin making a bid difference in need for saba/no change in rx needed  F/u yearly, sooner prn

## 2023-08-11 NOTE — Assessment & Plan Note (Signed)
-   Allergy profile 06/30/2020 >  Eos 0.3/  IgE  115  - Singulair 10 mg one   Daily 08/07/2020 >>>  Improved 07/10/2021 but still on dupixent and zyrtec  - off singulair since 07/2021 no flares while on dupixent reported 07/18/2022 > no change rx  Well controlled off singulair/ no change rx needed          Each maintenance medication was reviewed in detail including emphasizing most importantly the difference between maintenance and prns and under what circumstances the prns are to be triggered using an action plan format where appropriate.  Total time for H and P, chart review, counseling, reviewing hfa  device(s) and generating customized AVS unique to this office visit / same day charting = 20 min

## 2023-08-11 NOTE — Patient Instructions (Signed)
No change in medications   Please schedule a follow up visit in 12  months but call sooner if needed  

## 2023-08-14 ENCOUNTER — Ambulatory Visit: Payer: BC Managed Care – PPO | Admitting: Internal Medicine

## 2024-02-04 ENCOUNTER — Other Ambulatory Visit: Payer: Self-pay | Admitting: Internal Medicine

## 2024-04-28 ENCOUNTER — Telehealth: Payer: Self-pay

## 2024-04-28 NOTE — Telephone Encounter (Signed)
 Copied from CRM #[id number]. Topic: Clinical - Medical Advice >> Apr 28, 2024  9:22 AM [name] wrote: Reason for CRM: Please call patient back as he needs is exact asthma diagnoses as he is currently looking into his insurance coverages.  Pt has switched to Medicare and is trying to get supplements and asks what his diagnosis is, I advised the pt of his diagnosis COPD Gold III and pt verbalized understanding. Nothing further needed.
